# Patient Record
Sex: Female | Born: 1980 | Race: White | Hispanic: No | Marital: Single | Smoking: Former smoker
Health system: Southern US, Community
[De-identification: ages and names within clinical notes are randomized; demographics above are authoritative.]

## PROBLEM LIST (undated history)

## (undated) DIAGNOSIS — F99 Mental disorder, not otherwise specified: Secondary | ICD-10-CM

## (undated) DIAGNOSIS — I10 Essential (primary) hypertension: Secondary | ICD-10-CM

## (undated) DIAGNOSIS — C801 Malignant (primary) neoplasm, unspecified: Secondary | ICD-10-CM

## (undated) DIAGNOSIS — D649 Anemia, unspecified: Secondary | ICD-10-CM

## (undated) DIAGNOSIS — I82409 Acute embolism and thrombosis of unspecified deep veins of unspecified lower extremity: Secondary | ICD-10-CM

## (undated) HISTORY — DX: Mental disorder, not otherwise specified: F99

## (undated) HISTORY — DX: Malignant (primary) neoplasm, unspecified: C80.1

## (undated) HISTORY — DX: Anemia, unspecified: D64.9

## (undated) HISTORY — PX: DILATION AND CURETTAGE OF UTERUS: SHX78

## (undated) HISTORY — DX: Essential (primary) hypertension: I10

## (undated) HISTORY — DX: Acute embolism and thrombosis of unspecified deep veins of unspecified lower extremity: I82.409

---

## 2001-09-22 ENCOUNTER — Emergency Department (HOSPITAL_COMMUNITY): Admission: EM | Admit: 2001-09-22 | Discharge: 2001-09-23 | Payer: Self-pay | Admitting: Emergency Medicine

## 2001-09-23 ENCOUNTER — Encounter: Payer: Self-pay | Admitting: Emergency Medicine

## 2001-10-10 ENCOUNTER — Emergency Department (HOSPITAL_COMMUNITY): Admission: EM | Admit: 2001-10-10 | Discharge: 2001-10-11 | Payer: Self-pay | Admitting: Internal Medicine

## 2002-04-01 ENCOUNTER — Emergency Department (HOSPITAL_COMMUNITY): Admission: EM | Admit: 2002-04-01 | Discharge: 2002-04-01 | Payer: Self-pay | Admitting: *Deleted

## 2002-09-12 ENCOUNTER — Encounter: Payer: Self-pay | Admitting: Family Medicine

## 2002-09-12 ENCOUNTER — Ambulatory Visit (HOSPITAL_COMMUNITY): Admission: RE | Admit: 2002-09-12 | Discharge: 2002-09-12 | Payer: Self-pay | Admitting: Family Medicine

## 2002-09-20 ENCOUNTER — Inpatient Hospital Stay (HOSPITAL_COMMUNITY): Admission: AD | Admit: 2002-09-20 | Discharge: 2002-09-21 | Payer: Self-pay | Admitting: *Deleted

## 2003-07-18 ENCOUNTER — Inpatient Hospital Stay (HOSPITAL_COMMUNITY): Admission: EM | Admit: 2003-07-18 | Discharge: 2003-07-22 | Payer: Self-pay | Admitting: Internal Medicine

## 2003-12-19 ENCOUNTER — Inpatient Hospital Stay (HOSPITAL_COMMUNITY): Admission: EM | Admit: 2003-12-19 | Discharge: 2003-12-25 | Payer: Self-pay | Admitting: *Deleted

## 2004-03-25 ENCOUNTER — Ambulatory Visit (HOSPITAL_COMMUNITY): Admission: RE | Admit: 2004-03-25 | Discharge: 2004-03-25 | Payer: Self-pay | Admitting: Unknown Physician Specialty

## 2004-12-27 ENCOUNTER — Emergency Department (HOSPITAL_COMMUNITY): Admission: EM | Admit: 2004-12-27 | Discharge: 2004-12-27 | Payer: Self-pay | Admitting: Emergency Medicine

## 2005-02-22 ENCOUNTER — Ambulatory Visit (HOSPITAL_COMMUNITY): Admission: RE | Admit: 2005-02-22 | Discharge: 2005-02-22 | Payer: Self-pay | Admitting: Family Medicine

## 2005-03-09 ENCOUNTER — Emergency Department (HOSPITAL_COMMUNITY): Admission: EM | Admit: 2005-03-09 | Discharge: 2005-03-09 | Payer: Self-pay | Admitting: Emergency Medicine

## 2005-05-22 ENCOUNTER — Emergency Department (HOSPITAL_COMMUNITY): Admission: EM | Admit: 2005-05-22 | Discharge: 2005-05-22 | Payer: Self-pay | Admitting: Emergency Medicine

## 2005-08-09 ENCOUNTER — Emergency Department (HOSPITAL_COMMUNITY): Admission: EM | Admit: 2005-08-09 | Discharge: 2005-08-09 | Payer: Self-pay | Admitting: Emergency Medicine

## 2005-08-09 ENCOUNTER — Ambulatory Visit (HOSPITAL_COMMUNITY): Admission: RE | Admit: 2005-08-09 | Discharge: 2005-08-09 | Payer: Self-pay | Admitting: Emergency Medicine

## 2005-08-09 HISTORY — PX: SALPINGOOPHORECTOMY: SHX82

## 2006-03-04 ENCOUNTER — Emergency Department (HOSPITAL_COMMUNITY): Admission: EM | Admit: 2006-03-04 | Discharge: 2006-03-05 | Payer: Self-pay | Admitting: *Deleted

## 2006-04-14 ENCOUNTER — Ambulatory Visit: Payer: Self-pay | Admitting: Oncology

## 2006-06-21 ENCOUNTER — Emergency Department (HOSPITAL_COMMUNITY): Admission: EM | Admit: 2006-06-21 | Discharge: 2006-06-22 | Payer: Self-pay | Admitting: Emergency Medicine

## 2006-08-09 DIAGNOSIS — D649 Anemia, unspecified: Secondary | ICD-10-CM

## 2006-08-09 DIAGNOSIS — C801 Malignant (primary) neoplasm, unspecified: Secondary | ICD-10-CM

## 2006-08-09 HISTORY — DX: Malignant (primary) neoplasm, unspecified: C80.1

## 2006-08-09 HISTORY — DX: Anemia, unspecified: D64.9

## 2006-10-11 ENCOUNTER — Inpatient Hospital Stay (HOSPITAL_COMMUNITY): Admission: RE | Admit: 2006-10-11 | Discharge: 2006-10-14 | Payer: Self-pay | Admitting: Obstetrics and Gynecology

## 2006-10-11 ENCOUNTER — Encounter (INDEPENDENT_AMBULATORY_CARE_PROVIDER_SITE_OTHER): Payer: Self-pay | Admitting: Specialist

## 2007-04-08 ENCOUNTER — Emergency Department (HOSPITAL_COMMUNITY): Admission: EM | Admit: 2007-04-08 | Discharge: 2007-04-08 | Payer: Self-pay | Admitting: Emergency Medicine

## 2007-05-23 ENCOUNTER — Emergency Department (HOSPITAL_COMMUNITY): Admission: EM | Admit: 2007-05-23 | Discharge: 2007-05-23 | Payer: Self-pay | Admitting: Emergency Medicine

## 2007-12-12 ENCOUNTER — Other Ambulatory Visit: Admission: RE | Admit: 2007-12-12 | Discharge: 2007-12-12 | Payer: Self-pay | Admitting: Obstetrics and Gynecology

## 2008-04-11 ENCOUNTER — Inpatient Hospital Stay (HOSPITAL_COMMUNITY): Admission: AD | Admit: 2008-04-11 | Discharge: 2008-04-16 | Payer: Self-pay | Admitting: Obstetrics and Gynecology

## 2008-04-17 ENCOUNTER — Encounter (HOSPITAL_COMMUNITY): Admission: RE | Admit: 2008-04-17 | Discharge: 2008-05-08 | Payer: Self-pay | Admitting: Obstetrics and Gynecology

## 2008-05-09 ENCOUNTER — Encounter (HOSPITAL_COMMUNITY): Admission: RE | Admit: 2008-05-09 | Discharge: 2008-05-23 | Payer: Self-pay | Admitting: Obstetrics and Gynecology

## 2008-06-27 ENCOUNTER — Emergency Department (HOSPITAL_COMMUNITY): Admission: EM | Admit: 2008-06-27 | Discharge: 2008-06-27 | Payer: Self-pay | Admitting: Emergency Medicine

## 2008-07-14 ENCOUNTER — Inpatient Hospital Stay (HOSPITAL_COMMUNITY): Admission: AD | Admit: 2008-07-14 | Discharge: 2008-07-14 | Payer: Self-pay | Admitting: Obstetrics and Gynecology

## 2008-07-16 ENCOUNTER — Ambulatory Visit (HOSPITAL_COMMUNITY): Admission: RE | Admit: 2008-07-16 | Discharge: 2008-07-16 | Payer: Self-pay | Admitting: Obstetrics and Gynecology

## 2008-07-21 ENCOUNTER — Inpatient Hospital Stay (HOSPITAL_COMMUNITY): Admission: AD | Admit: 2008-07-21 | Discharge: 2008-07-29 | Payer: Self-pay | Admitting: Obstetrics and Gynecology

## 2008-07-22 ENCOUNTER — Encounter (INDEPENDENT_AMBULATORY_CARE_PROVIDER_SITE_OTHER): Payer: Self-pay | Admitting: Obstetrics and Gynecology

## 2008-07-25 ENCOUNTER — Encounter (INDEPENDENT_AMBULATORY_CARE_PROVIDER_SITE_OTHER): Payer: Self-pay | Admitting: Obstetrics and Gynecology

## 2008-07-25 ENCOUNTER — Ambulatory Visit: Payer: Self-pay | Admitting: Vascular Surgery

## 2008-08-12 ENCOUNTER — Observation Stay (HOSPITAL_COMMUNITY): Admission: EM | Admit: 2008-08-12 | Discharge: 2008-08-13 | Payer: Self-pay | Admitting: Emergency Medicine

## 2008-10-22 ENCOUNTER — Encounter (HOSPITAL_COMMUNITY): Admission: RE | Admit: 2008-10-22 | Discharge: 2008-11-21 | Payer: Self-pay | Admitting: Family Medicine

## 2009-05-04 ENCOUNTER — Emergency Department (HOSPITAL_COMMUNITY): Admission: EM | Admit: 2009-05-04 | Discharge: 2009-05-04 | Payer: Self-pay | Admitting: Emergency Medicine

## 2009-09-02 ENCOUNTER — Emergency Department (HOSPITAL_COMMUNITY): Admission: EM | Admit: 2009-09-02 | Discharge: 2009-09-02 | Payer: Self-pay | Admitting: Emergency Medicine
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
## 2009-09-16 ENCOUNTER — Other Ambulatory Visit: Admission: RE | Admit: 2009-09-16 | Discharge: 2009-09-16 | Payer: Self-pay | Admitting: Obstetrics & Gynecology

## 2009-11-29 ENCOUNTER — Emergency Department (HOSPITAL_COMMUNITY): Admission: EM | Admit: 2009-11-29 | Discharge: 2009-11-29 | Payer: Self-pay | Admitting: Emergency Medicine

## 2010-01-14 ENCOUNTER — Inpatient Hospital Stay (HOSPITAL_COMMUNITY): Admission: AD | Admit: 2010-01-14 | Discharge: 2010-01-14 | Payer: Self-pay | Admitting: Obstetrics & Gynecology

## 2010-01-14 ENCOUNTER — Ambulatory Visit: Payer: Self-pay | Admitting: Advanced Practice Midwife

## 2010-02-14 ENCOUNTER — Observation Stay (HOSPITAL_COMMUNITY): Admission: AD | Admit: 2010-02-14 | Discharge: 2010-02-15 | Payer: Self-pay | Admitting: Obstetrics and Gynecology

## 2010-02-23 ENCOUNTER — Inpatient Hospital Stay (HOSPITAL_COMMUNITY): Admission: AD | Admit: 2010-02-23 | Discharge: 2010-02-23 | Payer: Self-pay | Admitting: Obstetrics & Gynecology

## 2010-02-23 ENCOUNTER — Ambulatory Visit: Payer: Self-pay | Admitting: Advanced Practice Midwife

## 2010-02-25 ENCOUNTER — Inpatient Hospital Stay (HOSPITAL_COMMUNITY): Admission: RE | Admit: 2010-02-25 | Discharge: 2010-02-28 | Payer: Self-pay | Admitting: Obstetrics and Gynecology

## 2010-07-09 ENCOUNTER — Observation Stay (HOSPITAL_COMMUNITY)
Admission: EM | Admit: 2010-07-09 | Discharge: 2010-07-09 | Payer: Self-pay | Source: Home / Self Care | Admitting: Emergency Medicine

## 2010-08-30 ENCOUNTER — Encounter: Payer: Self-pay | Admitting: Emergency Medicine

## 2010-08-30 ENCOUNTER — Encounter: Payer: Self-pay | Admitting: Family Medicine

## 2010-10-19 LAB — CBC
MCHC: 32.5 g/dL (ref 30.0–36.0)
Platelets: 241 10*3/uL (ref 150–400)
RDW: 14.6 % (ref 11.5–15.5)
WBC: 11.5 10*3/uL — ABNORMAL HIGH (ref 4.0–10.5)

## 2010-10-19 LAB — DIFFERENTIAL
Basophils Absolute: 0 10*3/uL (ref 0.0–0.1)
Basophils Relative: 0 % (ref 0–1)
Lymphocytes Relative: 8 % — ABNORMAL LOW (ref 12–46)
Monocytes Absolute: 0.7 10*3/uL (ref 0.1–1.0)
Neutro Abs: 9.7 10*3/uL — ABNORMAL HIGH (ref 1.7–7.7)
Neutrophils Relative %: 85 % — ABNORMAL HIGH (ref 43–77)

## 2010-10-19 LAB — BASIC METABOLIC PANEL
BUN: 13 mg/dL (ref 6–23)
Calcium: 9.1 mg/dL (ref 8.4–10.5)
Creatinine, Ser: 0.8 mg/dL (ref 0.4–1.2)
GFR calc non Af Amer: >60 mL/min (ref >60)
Glucose, Bld: 114 mg/dL — ABNORMAL HIGH (ref 70–99)
Potassium: 4.4 mEq/L (ref 3.5–5.1)

## 2010-10-24 LAB — CBC
HCT: 27.1 % — ABNORMAL LOW (ref 36.0–46.0)
HCT: 34 % — ABNORMAL LOW (ref 36.0–46.0)
Hemoglobin: 11.4 g/dL — ABNORMAL LOW (ref 12.0–15.0)
Hemoglobin: 9.1 g/dL — ABNORMAL LOW (ref 12.0–15.0)
MCHC: 33.5 g/dL (ref 30.0–36.0)
MCV: 75.3 fL — ABNORMAL LOW (ref 78.0–100.0)
RBC: 3.6 MIL/uL — ABNORMAL LOW (ref 3.87–5.11)
RDW: 17.4 % — ABNORMAL HIGH (ref 11.5–15.5)
WBC: 10.8 10*3/uL — ABNORMAL HIGH (ref 4.0–10.5)
WBC: 11.6 10*3/uL — ABNORMAL HIGH (ref 4.0–10.5)

## 2010-10-24 LAB — COMPREHENSIVE METABOLIC PANEL
ALT: 10 U/L (ref 0–35)
AST: 16 U/L (ref 0–37)
Alkaline Phosphatase: 124 U/L — ABNORMAL HIGH (ref 39–117)
CO2: 20 mEq/L (ref 19–32)
Calcium: 8.5 mg/dL (ref 8.4–10.5)
GFR calc Af Amer: >60 mL/min (ref >60)
GFR calc non Af Amer: >60 mL/min (ref >60)
Glucose, Bld: 88 mg/dL (ref 70–99)
Potassium: 4 mEq/L (ref 3.5–5.1)
Sodium: 136 mEq/L (ref 135–145)
Total Protein: 5.5 g/dL — ABNORMAL LOW (ref 6.0–8.3)

## 2010-10-24 LAB — URINALYSIS, ROUTINE W REFLEX MICROSCOPIC
Bilirubin Urine: NEGATIVE
Hgb urine dipstick: NEGATIVE
Ketones, ur: 15 mg/dL — AB
Specific Gravity, Urine: >1.03 — ABNORMAL HIGH (ref 1.005–1.030)
pH: 6.5 (ref 5.0–8.0)

## 2010-10-24 LAB — URINE CULTURE: Culture  Setup Time: 201107182136

## 2010-10-24 LAB — GLUCOSE, CAPILLARY: Glucose-Capillary: 82 mg/dL (ref 70–99)

## 2010-10-24 LAB — URINE MICROSCOPIC-ADD ON

## 2010-10-24 LAB — TSH: TSH: 1.608 u[IU]/mL (ref 0.350–4.500)

## 2010-10-25 LAB — URINALYSIS, ROUTINE W REFLEX MICROSCOPIC
Ketones, ur: 15 mg/dL — AB
Nitrite: NEGATIVE
Nitrite: NEGATIVE
Protein, ur: NEGATIVE mg/dL
Specific Gravity, Urine: >1.03 — ABNORMAL HIGH (ref 1.005–1.030)
Urobilinogen, UA: 0.2 mg/dL (ref 0.0–1.0)
pH: 5.5 (ref 5.0–8.0)

## 2010-10-25 LAB — BARBITURATE, URINE, CONFIRMATION
Amobarbital UR Quant: NEGATIVE
Butalbital UR Quant: 550 ng/mL
Pentobarbital GC/MS Conf: NEGATIVE
Phenobarbital GC/MS Conf: NEGATIVE

## 2010-10-25 LAB — DIFFERENTIAL
Basophils Relative: 0 % (ref 0–1)
Lymphs Abs: 0.7 10*3/uL (ref 0.7–4.0)
Monocytes Relative: 5 % (ref 3–12)
Neutro Abs: 7.8 10*3/uL — ABNORMAL HIGH (ref 1.7–7.7)
Neutrophils Relative %: 86 % — ABNORMAL HIGH (ref 43–77)

## 2010-10-25 LAB — HCG, QUANTITATIVE, PREGNANCY: hCG, Beta Chain, Quant, S: 20990 m[IU]/mL — ABNORMAL HIGH (ref <5)

## 2010-10-25 LAB — CBC
HCT: 35.4 % — ABNORMAL LOW (ref 36.0–46.0)
Hemoglobin: 10.4 g/dL — ABNORMAL LOW (ref 12.0–15.0)
MCHC: 33.2 g/dL (ref 30.0–36.0)
MCV: 75 fL — ABNORMAL LOW (ref 78.0–100.0)
Platelets: 155 10*3/uL (ref 150–400)
RBC: 4.55 MIL/uL (ref 3.87–5.11)
RDW: 17 % — ABNORMAL HIGH (ref 11.5–15.5)
WBC: 12 10*3/uL — ABNORMAL HIGH (ref 4.0–10.5)
WBC: 8.4 10*3/uL (ref 4.0–10.5)
WBC: 9 10*3/uL (ref 4.0–10.5)

## 2010-10-25 LAB — MRSA CULTURE

## 2010-10-25 LAB — DRUGS OF ABUSE SCREEN W/O ALC, ROUTINE URINE
Amphetamine Screen, Ur: NEGATIVE
Cocaine Metabolites: NEGATIVE
Methadone: NEGATIVE
Phencyclidine (PCP): NEGATIVE

## 2010-10-25 LAB — PROTIME-INR
INR: 0.96 (ref 0.00–1.49)
Prothrombin Time: 12.7 seconds (ref 11.6–15.2)

## 2010-10-25 LAB — BASIC METABOLIC PANEL
BUN: 4 mg/dL — ABNORMAL LOW (ref 6–23)
CO2: 23 mEq/L (ref 19–32)
Calcium: 9.2 mg/dL (ref 8.4–10.5)
Calcium: 8.4 mg/dL (ref 8.4–10.5)
Chloride: 105 mEq/L (ref 96–112)
Creatinine, Ser: 0.56 mg/dL (ref 0.4–1.2)
GFR calc Af Amer: >60 mL/min (ref >60)
Glucose, Bld: 83 mg/dL (ref 70–99)
Potassium: 4.2 mEq/L (ref 3.5–5.1)
Sodium: 135 mEq/L (ref 135–145)

## 2010-10-25 LAB — COMPREHENSIVE METABOLIC PANEL
ALT: 9 U/L (ref 0–35)
AST: 12 U/L (ref 0–37)
CO2: 21 mEq/L (ref 19–32)
Calcium: 8.5 mg/dL (ref 8.4–10.5)
GFR calc Af Amer: >60 mL/min (ref >60)
GFR calc non Af Amer: >60 mL/min (ref >60)
Potassium: 3.6 mEq/L (ref 3.5–5.1)
Sodium: 135 mEq/L (ref 135–145)
Total Protein: 5.6 g/dL — ABNORMAL LOW (ref 6.0–8.3)

## 2010-10-25 LAB — SURGICAL PCR SCREEN: Staphylococcus aureus: INVALID — AB

## 2010-10-25 LAB — APTT: aPTT: 22 seconds — ABNORMAL LOW (ref 24–37)

## 2010-10-25 LAB — GLUCOSE, CAPILLARY: Glucose-Capillary: 83 mg/dL (ref 70–99)

## 2010-10-26 LAB — URINALYSIS, ROUTINE W REFLEX MICROSCOPIC
Glucose, UA: NEGATIVE mg/dL
Ketones, ur: NEGATIVE mg/dL
Nitrite: NEGATIVE
Protein, ur: NEGATIVE mg/dL
pH: 5 (ref 5.0–8.0)

## 2010-10-26 LAB — FETAL FIBRONECTIN: Fetal Fibronectin: NEGATIVE

## 2010-10-26 LAB — STREP B DNA PROBE

## 2010-10-26 LAB — GC/CHLAMYDIA PROBE AMP, GENITAL: GC Probe Amp, Genital: NEGATIVE

## 2010-10-27 LAB — DIFFERENTIAL
Basophils Absolute: 0 10*3/uL (ref 0.0–0.1)
Basophils Relative: 0 % (ref 0–1)
Lymphocytes Relative: 13 % (ref 12–46)
Monocytes Absolute: 0.7 10*3/uL (ref 0.1–1.0)
Neutro Abs: 8.1 10*3/uL — ABNORMAL HIGH (ref 1.7–7.7)
Neutrophils Relative %: 80 % — ABNORMAL HIGH (ref 43–77)

## 2010-10-27 LAB — COMPREHENSIVE METABOLIC PANEL
Albumin: 3 g/dL — ABNORMAL LOW (ref 3.5–5.2)
Alkaline Phosphatase: 62 U/L (ref 39–117)
BUN: 6 mg/dL (ref 6–23)
CO2: 24 mEq/L (ref 19–32)
Chloride: 105 mEq/L (ref 96–112)
Creatinine, Ser: 0.49 mg/dL (ref 0.4–1.2)
GFR calc non Af Amer: >60 mL/min (ref >60)
Glucose, Bld: 75 mg/dL (ref 70–99)
Potassium: 3.9 mEq/L (ref 3.5–5.1)
Total Bilirubin: 0.3 mg/dL (ref 0.3–1.2)

## 2010-10-27 LAB — URINALYSIS, ROUTINE W REFLEX MICROSCOPIC
Bilirubin Urine: NEGATIVE
Nitrite: NEGATIVE
Specific Gravity, Urine: >1.03 — ABNORMAL HIGH (ref 1.005–1.030)
Urobilinogen, UA: 0.2 mg/dL (ref 0.0–1.0)
pH: 6 (ref 5.0–8.0)

## 2010-10-27 LAB — CBC
HCT: 33.5 % — ABNORMAL LOW (ref 36.0–46.0)
Hemoglobin: 11.6 g/dL — ABNORMAL LOW (ref 12.0–15.0)
MCV: 77.3 fL — ABNORMAL LOW (ref 78.0–100.0)
Platelets: 202 10*3/uL (ref 150–400)
WBC: 10.1 10*3/uL (ref 4.0–10.5)

## 2010-11-23 LAB — ABO/RH: ABO/RH(D): A POS

## 2010-11-23 LAB — CBC
HCT: 35.9 % — ABNORMAL LOW (ref 36.0–46.0)
Hemoglobin: 11.1 g/dL — ABNORMAL LOW (ref 12.0–15.0)
Hemoglobin: 11.8 g/dL — ABNORMAL LOW (ref 12.0–15.0)
MCHC: 32 g/dL (ref 30.0–36.0)
MCHC: 32.8 g/dL (ref 30.0–36.0)
MCV: 77.2 fL — ABNORMAL LOW (ref 78.0–100.0)
Platelets: 249 10*3/uL (ref 150–400)
RBC: 4.47 MIL/uL (ref 3.87–5.11)
RDW: 16.1 % — ABNORMAL HIGH (ref 11.5–15.5)

## 2010-11-23 LAB — BASIC METABOLIC PANEL
CO2: 25 mEq/L (ref 19–32)
Chloride: 109 mEq/L (ref 96–112)
GFR calc Af Amer: >60 mL/min (ref >60)
Potassium: 3.3 mEq/L — ABNORMAL LOW (ref 3.5–5.1)
Sodium: 141 mEq/L (ref 135–145)

## 2010-11-23 LAB — DIFFERENTIAL
Basophils Absolute: 0 10*3/uL (ref 0.0–0.1)
Basophils Relative: 0 % (ref 0–1)
Basophils Relative: 0 % (ref 0–1)
Eosinophils Absolute: 0.1 10*3/uL (ref 0.0–0.7)
Eosinophils Relative: 1 % (ref 0–5)
Monocytes Absolute: 0.3 10*3/uL (ref 0.1–1.0)
Monocytes Absolute: 0.5 10*3/uL (ref 0.1–1.0)
Monocytes Relative: 7 % (ref 3–12)
Neutro Abs: 4.7 10*3/uL (ref 1.7–7.7)
Neutro Abs: 6.4 10*3/uL (ref 1.7–7.7)

## 2010-11-23 LAB — POCT I-STAT, CHEM 8
BUN: 7 mg/dL (ref 6–23)
Chloride: 110 mEq/L (ref 96–112)
Sodium: 142 mEq/L (ref 135–145)

## 2010-12-20 ENCOUNTER — Emergency Department (HOSPITAL_COMMUNITY)
Admission: EM | Admit: 2010-12-20 | Discharge: 2010-12-20 | Disposition: A | Discharge status: Home / Self Care | Payer: Medicaid Other | Attending: Emergency Medicine | Admitting: Emergency Medicine

## 2010-12-20 ENCOUNTER — Emergency Department (HOSPITAL_COMMUNITY): Payer: Medicaid Other

## 2010-12-20 DIAGNOSIS — R112 Nausea with vomiting, unspecified: Secondary | ICD-10-CM | POA: Insufficient documentation

## 2010-12-20 DIAGNOSIS — J45909 Unspecified asthma, uncomplicated: Secondary | ICD-10-CM | POA: Insufficient documentation

## 2010-12-20 DIAGNOSIS — R079 Chest pain, unspecified: Secondary | ICD-10-CM | POA: Insufficient documentation

## 2010-12-20 DIAGNOSIS — R0602 Shortness of breath: Secondary | ICD-10-CM | POA: Insufficient documentation

## 2010-12-20 DIAGNOSIS — Z86718 Personal history of other venous thrombosis and embolism: Secondary | ICD-10-CM | POA: Insufficient documentation

## 2010-12-20 LAB — URINALYSIS, ROUTINE W REFLEX MICROSCOPIC
Glucose, UA: NEGATIVE mg/dL
Protein, ur: 30 mg/dL — AB

## 2010-12-20 LAB — DIFFERENTIAL
Eosinophils Absolute: 0.1 10*3/uL (ref 0.0–0.7)
Eosinophils Relative: 1 % (ref 0–5)
Lymphs Abs: 2.9 10*3/uL (ref 0.7–4.0)
Monocytes Absolute: 0.6 10*3/uL (ref 0.1–1.0)
Monocytes Relative: 6 % (ref 3–12)

## 2010-12-20 LAB — URINE MICROSCOPIC-ADD ON

## 2010-12-20 LAB — CBC
MCH: 24.9 pg — ABNORMAL LOW (ref 26.0–34.0)
MCHC: 32.1 g/dL (ref 30.0–36.0)
MCV: 77.8 fL — ABNORMAL LOW (ref 78.0–100.0)
Platelets: 226 10*3/uL (ref 150–400)
RDW: 14.2 % (ref 11.5–15.5)

## 2010-12-20 LAB — BASIC METABOLIC PANEL
BUN: 14 mg/dL (ref 6–23)
CO2: 24 mEq/L (ref 19–32)
Calcium: 10 mg/dL (ref 8.4–10.5)
GFR calc non Af Amer: >60 mL/min (ref >60)
Glucose, Bld: 119 mg/dL — ABNORMAL HIGH (ref 70–99)

## 2010-12-20 LAB — POCT CARDIAC MARKERS: Troponin i, poc: <0.05 ng/mL (ref 0.00–0.09)

## 2010-12-22 NOTE — H&P (Signed)
[name], [name]              ACCOUNT NO.:  [id number]   MEDICAL RECORD NO.:  [id number]          PATIENT TYPE:  OUT   LOCATION:  ULT                           [facility]ILITY:  [facility]   PHYSICIAN:  Charles A. Delcambre, MDDATE OF BIRTH:  1981/07/21   DATE OF ADMISSION:  07/16/2008  DATE OF DISCHARGE:                              HISTORY & PHYSICAL   HISTORY OF PRESENT ILLNESS:  A 30 year old gravida 1, para 0 to be  admitted on 07/21/2008 at 7:30 for induction.  She will be 39 weeks and  1 day with pregnancy complicated by history of a DVT and pulmonary  embolism, on birth control pills.  I spoke with prenatal doctor who I  cannot recall the name of, and she was noted to be told that she was  factor V Leiden positive, but we were unable to get more information.  I  spoke to Dr. Sherrie George at that time, I believe, and she did recommend  therapeutic heparin throughout the pregnancy, I do not have that  information in front of me so I may be in error in stating that, and  planned to have therapeutic heparin during pregnancy with Lovenox.  She  was changed to heparin approximately 1 week ago.  She is obese, and for  this reason, she has bruising on her abdomen as evidence of heparin  injection, but heparin levels have been subtherapeutic and even,  basically, 0 on checks on Lovenox  for anti-Xa routine run as well as a  heparin level 2 days ago with heparin level drawn.  She has been on  10,000 units subcu q.8 h. over the last week.  She has had no other  complications during her pregnancy.   PAST MEDICAL HISTORY:  As noted above.  Repeat factor V Leiden by me was  negative.  She did not follow up with a Maternal-Fetal consult as  directed on several occasions, with missed appointment.  Family history  is very strong for DVT in her father.  Past medical history:  Pulmonary  embolus; asthma, not on medications; endometriosis, presumed, diagnosed  at age 17; anemia, postherpetic neuralgia.   PAST SURGICAL HISTORY:  D and C in the past of endometrial lining with  menorrhagia, benign pathology.  She had a laparotomy with a large  ovarian mass, and I believe it was the left ovary, I do not have that  information in front of me, and this tumor did come back.  She had left  ovary and tube removed, Sertoli-Leydig cell tumor of the ovary, benign  fallopian tubes, and this was in March 2008.   MEDICATIONS:  Prenatal vitamins currently, heparin 10,000 units subcu  q.8 h.   ALLERGIES:  No known drug allergies.   SOCIAL HISTORY:  Not married, not currently sexually active, does not  know father of the baby.  No alcohol or drugs.   FAMILY HISTORY:  As noted above, otherwise noncontributory.   PHYSICAL EXAMINATION:  VITALS:  Weight 312 pounds, blood pressure  132/98, no symptoms of PIH, this was at 38 weeks 3 days in December  2008; respirations  18, afebrile, and pulse 80.  CORONARY:  Regular rate and rhythm.  LUNGS:  Clear bilaterally.  ABDOMEN:  Difficult to palpate accurately fundal height secondary to  previous surgery and obesity.  Fetal heart rate has been in the 150s.  On that day, she notes to have had good fetal movement.  Cervix was  examined and was closed, posterior.  EXTREMITIES:  Mild edema bilaterally and symmetrical.   ASSESSMENT:  Intrauterine pregnancy 39 weeks 1 day, history of deep  venous thrombosis and pulmonary embolus, survived, obviously, and to be  admitted at 39 weeks 1 day secondary to difficulties with heparin as  noted above and history of deep venous thrombosis and pulmonary embolus.   PLAN:  Admit, Cervidil, once appropriate AROM.  We will therapeutically  heparinize IV until AROM or until she gets to 3-4 cm and active; we will  reverse heparin if necessary.  She understands that if we go 48 hours  without significant change that we will discontinue the heparin at the  appropriate time and proceed to a cesarean section.  All questions were   answered.  She will be admitted as noted, Ambien as needed, and heparin  per pharmacy.      Charles A. Sydnee Cabal, MD  Electronically Signed     CAD/MEDQ  D:  07/17/2008  T:  07/17/2008  Job:  [id number]
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
## 2010-12-22 NOTE — Discharge Summary (Signed)
NAMEELFRIEDE, BONINI              ACCOUNT NO.:  [id number]   MEDICAL RECORD NO.:  [id number]          PATIENT TYPE:  INP   LOCATION:  9306                          [facility]ACILITY:  [facility]   PHYSICIAN:  Gerald Leitz, MD          DATE OF BIRTH:  [birth year]   DATE OF ADMISSION:  04/11/2008  DATE OF DISCHARGE:  04/16/2008                               DISCHARGE SUMMARY   INDICATION FOR ADMISSION:  1. A 24-5/7 week intrauterine pregnancy.  2. Status post fall, rule out rupture.  3. Muscle spasms.  4. Back pain.  5. Factor V Leiden mutation.  6. History of previous deep vein thrombosis in 2005.   DISCHARGE DIAGNOSES:  1. A 25-week 2-day intrauterine pregnancy.  2. Status post fall.  3. Muscle spasms.  4. Back pain.  5. Factor V Leiden mutation.  6. History of previous deep vein thrombosis in 2005.   BRIEF HOSPITAL COURSE:  The patient was admitted on April 11, 2008,  after being transferred from [facility].  She sustained a fall  with no injury to her abdomen, however, had significant back pain and  muscle spasms and was unable to ambulate.  During her admission, she  improved significantly.  She received physical therapy daily.  She also  had rule out abruption.   LABS:  KUB was negative.  Ultrasound performed on April 11, 2008,  showed no evidence of abruption.   She received Flexeril and Dilaudid initially for pain as her mobility  and pain improved.  Dilaudid was discontinued and she is discharged home  on the following medications, Flexeril 10 mg 1 p.o. q.8 h. p.r.n. as  well as oxycodone 1-2 p.o. q.4-6 h.  The patient has a history of factor  five Leiden mutation and is on Lovenox 40 mg subcu b.i.d.  she will  continue this at home as well as well as her prenatal vitamins.   ACTIVITY:  Ad lib.  She is also to undergo outpatient physical therapy  at least 3 days a week.   CONDITION AT DISCHARGE:  Improved and stable.   FOLLOWUP:  She will follow up with Dr.  Sydnee Cabal in approximately 2  weeks.      Gerald Leitz, MD  Electronically Signed     TC/MEDQ  D:  04/16/2008  T:  04/16/2008  Job:  [id number]
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
## 2010-12-22 NOTE — Op Note (Signed)
[name], [name]              ACCOUNT NO.:  [id number]   MEDICAL RECORD NO.:  [id number]          PATIENT TYPE:  INP   LOCATION:  9122                          [facility]ACILITY:  [facility]   PHYSICIAN:  Charles A. Delcambre, MDDATE OF BIRTH:  05-29-1981   DATE OF PROCEDURE:  07/23/2008  DATE OF DISCHARGE:                               OPERATIVE REPORT   PREOPERATIVE DIAGNOSES:  1. Intrauterine pregnancy at 39 weeks and 3 days.  2. History of pulmonary embolus/deep venous thrombosis on birth      control pills.  3. Fetal intolerance of labor.   POSTOPERATIVE DIAGNOSES:  1. Intrauterine pregnancy at 39 weeks and 3 days.  2. History of pulmonary embolus/deep venous thrombosis on birth      control pills.  3. Fetal intolerance of labor.  4. Pelvic adhesive disease as well.   PROCEDURES:  1. Primary low transverse cesarean section.  2. Lysis of adhesions.   SURGEON:  Charles A. Sydnee Cabal, MD.   ASSISTANT:  Gerald Leitz, MD   COMPLICATIONS:  None.   ESTIMATED BLOOD LOSS:  600 mL.   ANESTHESIA:  Epidural.   OPERATIVE FINDINGS:  Multiple omental adhesions to the anterior  abdominal wall.   SPECIMEN:  Placenta to pathology.  Cord arterial blood gas pH was 7.31.  Cord venous blood gas pH was 7.37.  Infant was a vigorous female, 8 pounds  4 ounces.   Instrument, sponge, and needle count correct x2.   DESCRIPTION OF PROCEDURE:  The patient was taken to the operating room  and placed in supine position.  Anesthesia was dosed.  Epidural was  adequate.  Vertical skin incision was made to encircle the skin lesion  just beneath the umbilicus.  This was sent to pathology.  The knife was  used to remove the old scar and a vertical incision carried down through  the subcutaneous layer to fascia.  The fascia was incised with knife and  Mayo scissors.  Peritoneum was entered without injury to the bladder or  bowel.  Omental adhesions were taken down successfully with isolation of  pedicles and 2-0  Vicryl ties.  This allowed adequate exposure of the  lower uterine segment and freed up the fascia on either side.  The  bladder blade was placed and vesicouterine peritoneum was incised with  the Metzenbaum scissors.  Blunt dissection was used to develop the  bladder flap.  Bladder blade was replaced.  A lower uterine segment  transverse incision amniotomy was made.  There was no damage to the  infant.  Clear amniotic fluid was noted.  Fundal pressure by the  operator's assistant was used to deliver the baby without difficulty.  Cord was clamped and infant was cut free and handed to neonatologist,  Ruben Gottron, who was in attendance for the delivery.  Cord gases were  taken.  Cord blood was taken.  Placenta was manually expressed.  Uterus  was then wiped with a dry lap.  Uterine incision was then closed with #1  chromic running locking suture with 2 figure-of-eight sutures placed.  This achieved good hemostasis.  Irrigation was carried  out.  Hemostasis  was verified.  Bladder flap hemostasis was excellent.  The right ovary  was visualized and was normal.  The left ovary was surgically absent.  The fascia and peritoneum was then closed en bloc with #1 PDS running  nonlocking suture.  The subcutaneous layer was irrigated.  Minor  electrocautery was placed to achieve hemostasis.  Plain gut 0 was used  to place the interrupted sutures in the deep fatty layer subcutaneously  closing this area.  The staples were used to close the skin.  Sterile  dressing was applied.  The patient tolerated the procedure well and was  taken to recovery with physician in attendance.      Charles A. Sydnee Cabal, MD  Electronically Signed     CAD/MEDQ  D:  07/23/2008  T:  07/24/2008  Job:  [id number]

## 2010-12-25 ENCOUNTER — Emergency Department (HOSPITAL_COMMUNITY): Payer: Medicaid Other

## 2010-12-25 ENCOUNTER — Emergency Department (HOSPITAL_COMMUNITY)
Admission: EM | Admit: 2010-12-25 | Discharge: 2010-12-25 | Disposition: A | Discharge status: Home / Self Care | Payer: Medicaid Other | Attending: Emergency Medicine | Admitting: Emergency Medicine

## 2010-12-25 DIAGNOSIS — M79609 Pain in unspecified limb: Secondary | ICD-10-CM | POA: Insufficient documentation

## 2010-12-25 DIAGNOSIS — M7989 Other specified soft tissue disorders: Secondary | ICD-10-CM | POA: Insufficient documentation

## 2010-12-25 DIAGNOSIS — Z86711 Personal history of pulmonary embolism: Secondary | ICD-10-CM | POA: Insufficient documentation

## 2010-12-25 DIAGNOSIS — S93609A Unspecified sprain of unspecified foot, initial encounter: Secondary | ICD-10-CM | POA: Insufficient documentation

## 2010-12-25 DIAGNOSIS — Y92009 Unspecified place in unspecified non-institutional (private) residence as the place of occurrence of the external cause: Secondary | ICD-10-CM | POA: Insufficient documentation

## 2010-12-25 DIAGNOSIS — IMO0002 Reserved for concepts with insufficient information to code with codable children: Secondary | ICD-10-CM | POA: Insufficient documentation

## 2010-12-25 DIAGNOSIS — J45909 Unspecified asthma, uncomplicated: Secondary | ICD-10-CM | POA: Insufficient documentation

## 2010-12-25 DIAGNOSIS — D6859 Other primary thrombophilia: Secondary | ICD-10-CM | POA: Insufficient documentation

## 2010-12-25 DIAGNOSIS — Z86718 Personal history of other venous thrombosis and embolism: Secondary | ICD-10-CM | POA: Insufficient documentation

## 2010-12-25 DIAGNOSIS — W230XXA Caught, crushed, jammed, or pinched between moving objects, initial encounter: Secondary | ICD-10-CM | POA: Insufficient documentation

## 2010-12-25 NOTE — Op Note (Signed)
NAMEARLISHA, PATALANO              ACCOUNT NO.:  [id number]   MEDICAL RECORD NO.:  [id number]          PATIENT TYPE:  INP   LOCATION:  9399                          FACILITY:  [facility]   PHYSICIAN:  Charles A. 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   SURGEON:  Charles A. Sydnee Cabal, MD   ASSISTANT:  Gerald Leitz, MD   COMPLICATIONS:  None.   ESTIMATED BLOOD LOSS:  200 mL.   ANESTHESIA:  General by the endotracheal route.   SPECIMEN:  Left tube and ovary to pathology, see above, frozen;  permanent is pending.   IV FLUIDS:  1700 lactated Ringer's   URINE OUTPUT:  See OR record.   INSTRUMENT SPONGE AND NEEDLE COUNTS:  Correct x2.   DESCRIPTION OF PROCEDURE:  The patient was taken to the operating room,  placed in the supine position; and general anesthetic was induced  without difficulty.  Sterile prep and drape was undertaken.  A vertical  skin incision was made, and carried down to fascia.  Fascia was incised  with the knife and Mayo scissors.  Peritoneum was entered carefully with  Metzenbaum scissors.  No damage to bowel, bladder, or vasculature  structures was noted.  Ovarian mass could be seen.  Incision was  extended and a self-retaining retractor was placed.  Bowel was packed  away from the ovary.  Infundibulopelvic pedicle was skeletonized and  cross clamped; and uterine ovarian pedicle was clamped.  The ovarian  mass was removed and sent to pathology.   Bleeding was encountered at the infundibulopelvic pedicle; this was  retied carefully.  There was a window of peritoneum that continued  oozing; and 2-0 Vicryl was used to achieve hemostasis in this  area.  There was a small hematoma noted, at that time, approximately 3 x 3 cm  on the side wall, just inferior to the infundibulopelvic pedicle.  This  was observed during the case; and was noted to be stable.   Attention was turned to the uterus, and uterus appeared normal  otherwise; #0 Vicryl was used to transfixion stitch the utero-ovarian  pedicle; and the peritoneal edge was run and locked with 2-0 Vicryl  between the sidewall and the infundibulopelvic and utero-ovarian  pedicle.  The right ovary appeared normal.  Uterus was normal.  Tube was  ovary on the right.  Irrigation was carried out and hemostasis was  excellent.  Hematoma was stable; and with that reason the procedure was  terminated; and decision was made to close.  The laps were removed; and  then retractor was removed.  The counts were correct; and the fascia was  then closed with running #1 PDS nonlocking suture.  Subcutaneous 2-0  plain gut was used to close the subcutaneous space.  Hemostasis was  verified.  Irrigation was  carried out, before closure of all layers.  Sterile skin clips were used to close the skin.  A sterile dressing was  applied.  The patient was taken to recovery with physician in attendance      Charles A. Sydnee Cabal, MD  Electronically Signed     CAD/MEDQ  D:  10/11/2006  T:  10/11/2006  Job:  [id number]

## 2010-12-25 NOTE — Group Therapy Note (Signed)
NAME:  [name], [name]                        ACCOUNT NO.:  [id number]   MEDICAL RECORD NO.:  [id number]                   PATIENT TYPE:  INP   LOCATION:  A214                                 FACILITY:  [facility]   PHYSICIAN:  Edward L. Juanetta Gosling, M.D.             DATE OF BIRTH:  02-05-1981   DATE OF PROCEDURE:  DATE OF DISCHARGE:                                   PROGRESS NOTE   PROBLEMS:  Pulmonary embolism, rectal bleeding probably from hemorrhoids,  menorrhagia.   HISTORY OF PRESENT ILLNESS:  [name] had some rectal bleeding after being  seen yesterday.  She was transferred to second floor as a precautionary  measure considering that she is on Lovenox.   PHYSICAL EXAMINATION:  VITAL SIGNS:  Temperature 97.3, pulse 95,  respirations 20, blood pressure 126/79.  O2 saturation 100% on 2 liters.  EXTREMITIES:  She says she feels okay, except she still has a lot of left  leg pain.   LABORATORY DATA:  Hemoglobin level is now 7.5.  She was 8.3 on admission  with a very low MCV.  Prothrombin time is 13.9.   ASSESSMENT:  She is anemic and probably will require blood transfusion.  I  will leave that decision to her primary care physicians.  Otherwise, she  seems to be doing fairly well.  Anticoagulation is underway.      ___________________________________________                                            Oneal Deputy. Juanetta Gosling, M.D.   ELH/MEDQ  D:  12/20/2003  T:  12/20/2003  Job:  [id number]

## 2010-12-25 NOTE — H&P (Signed)
NAME:  [name], [name]                        ACCOUNT NO.:  [id number]   MEDICAL RECORD NO.:  [id number]                   PATIENT TYPE:  INP   LOCATION:  A214                                 FACILITY:  [facility]   PHYSICIAN:  Langley Gauss, M.D.                DATE OF BIRTH:  1981-06-13   DATE OF ADMISSION:  DATE OF DISCHARGE:                                HISTORY & PHYSICAL   HISTORY OF PRESENT ILLNESS:  The patient is a 30 year old gravida 0, para 0  with last menstrual period onset of Dec 20, 2003.  The patient is currently  admitted with findings of DVT and pulmonary embolus.  The patient presented  to the emergency room and was admitted on Dec 19, 2003, with a swollen leg  and subsequent workup also evaluated pulmonary embolization.  The patient is  currently receiving full anticoagulation treatment.  She initially was  treated with IV heparin and then switched to Lovenox, and has been started  recently on Coumadin.  The patient had onset of menstrual bleeding on Dec 20, 2003.  Consultation is requested as she is currently anemic and being  transfused.  Consultation requested in an effort to limit current bleeding  problems.  The patient has been followed by Dr. Almetta Lovely in [place], and  has been on continuous Necon 1/50 since January, 2005.  She states that this  is generally why her bleeding is improved.  She states that on this regimen  since January of 2005, she has had about five days of bleeding each month  with passage of some clots.  She had been treated with cyclical birth  control pills with no marked improvement in her bleeding pattern.  The  patient is known to me from previous medical care provided September 10, 2002.  At that time, the patient presented with dysfunctional uterine bleeding,  marked obesity, and hemoglobin of 6.4.  She received IV heparin.  She was  transfused, and had a D&C performed outpatient basis at that time which  revealed findings of  dyssynchronous endometrium consistent with chronic  anovulatory state.  The patient was recommended to follow up for serial  Provera withdrawals; however, subsequently failed to follow up for  continuing and ongoing care.  Subsequently she continued with her pattern of  irregular bleeding episodes related to her marked obesity and probable  chronic anovulatory cycle.  She has been seen on two occasions since that  time by Dr. Almetta Lovely in [place].   Notably, the patient was on birth control pills for menstrual regulation as  well as to provide birth control purposes.  The oral contraceptives have  been appropriately discontinued upon her diagnosis of the DVT and PE.  She  will require birth control in the very near future.   PAST MEDICAL HISTORY:  Pertinent for a positive family history for  thromboembolic disorder.   She had a kidney stone  in 1998.  No other medical or surgical history.  She  is noted to be markedly obese.  She does describe a greater than 100 pound  weight loss.  In February of 2004, she was 303 pounds, and was 5 feet, 5  inches tall.   CURRENT MEDICATIONS:  Necon 1/50.   PHYSICAL EXAMINATION:  GENERAL:  She is noted to be an obese female though  in no acute distress.  She is receiving some supplemental oxygen therapy.  VITAL SIGNS:  Temperature 98.6, blood pressure 107/63, pulse 92, respiratory  rate 18.  HEENT:  Negative.  There is no adenopathy.  ABDOMEN:  Obese, no palpable masses identified.  PELVIC:  Exam reveals moderate vaginal flow.  The patient describes the  current menses as what she typically experiences.  She denies any passage of  clots at this point in time.  Bimanual examination is not performed.   LABORATORY DATA:  Dec 21, 2003, hemoglobin 6.9, hematocrit 22.8.   Review of old medical record brought from the office reveals a pelvic  ultrasound dated September 12, 2002, which revealed subserosal pedunculated  fibroid, maximum diameter 6.6 cm.   Uterus itself, total length 7.2 cm,  slightly thickened endometrial stripe at 14.2 mm.  Normal right and left  ovary.   ASSESSMENT:  36. A 30 year old patient with longstanding problems with irregular menstrual     periods due to her morbid obesity, now diagnosed with PE and DVT.  Thus,     oral contraceptives would be contraindicated in the future. She will     require immediate initiation of birth control to avoid the complicating     factor of pregnancy.  She is currently being anticoagulated and has now     appropriately started a menses after discontinuation of birth control     pills which would be as expected.  The patient is recommended to begin     treatment with Depo-Provera 150 mg IM for birth control purposes.  In     addition, though this may additionally result in some irregular bleeding,     over the long term, will result in endometrial atrophy as well as     possible shrinkage on the uterine fibroid due to its anti-estrogenic     effects.  2. During this hospitalization, in addition, ultrasound can be repeated to     ascertain whether there has been in any change in the status of her     pedunculated fibroid.  Pertinently, though, she has been under the care     of Dr. Almetta Lovely.  No additional ultrasounds have been performed     since that dated September 12, 2002.     ___________________________________________                                         Langley Gauss, M.D.   DC/MEDQ  D:  12/22/2003  T:  12/22/2003  Job:  [id number]   cc:   [facility]   Jeoffrey Massed, M.D.  [address]  [place]  [place] [address]  Fax: [phone]

## 2010-12-25 NOTE — Group Therapy Note (Signed)
NAME:  LISSETE, MAESTAS                        ACCOUNT NO.:  [id number]   MEDICAL RECORD NO.:  [id number]                   PATIENT TYPE:  INP   LOCATION:  A214                                 FACILITY:  [facility]   PHYSICIAN:  Edward L. Juanetta Gosling, M.D.             DATE OF BIRTH:  15-Oct-1980   DATE OF PROCEDURE:  12/23/2003  DATE OF DISCHARGE:                                   PROGRESS NOTE   PROBLEMS:  Pulmonary embolism.   SUBJECTIVE:  Ms. Breisch says she is feeling a little better.  She still has  an occasional bout of being short of breath, but the nebulizer treatments  help.  Her leg is still quite painful.   OBJECTIVE:  Her exam this morning shows her temperature is 96.7, pulse 83,  respirations 20, blood pressure 114/62, O2 saturation 99%.  Chest is  clearer.  She looks better in general.   Lab work shows that her hemoglobin is 8.5.  B-met shows all normal.  Prothrombin time is 16.5 with INR of 1.5.  I have discussed this with Dr.  Gerda Diss.  It is okay for her to be up and moving, and she can go ahead with  increasing dose of Coumadin to see if we can get her prothrombin time to  therapeutic level.      ___________________________________________                                            Oneal Deputy. Juanetta Gosling, M.D.   ELH/MEDQ  D:  12/23/2003  T:  12/23/2003  Job:  [id number]

## 2010-12-25 NOTE — Group Therapy Note (Signed)
NAME:  DYMON, [name]                        ACCOUNT NO.:  [id number]   MEDICAL RECORD NO.:  [id number]                   PATIENT TYPE:  INP   LOCATION:  A214                                 FACILITY:  [facility]   PHYSICIAN:  Edward L. Juanetta Gosling, M.D.             DATE OF BIRTH:  Nov 16, 1980   DATE OF PROCEDURE:  DATE OF DISCHARGE:                                   PROGRESS NOTE   PROBLEMS:  1. Pulmonary embolus.  2. Thrombophlebitis.   SUBJECTIVE:  [name] says that she feels a little better today.  She was  able to move around in the room a little bit yesterday, which is an  improvement.   PHYSICAL EXAMINATION:  VITAL SIGNS:  Temperature 97.6; pulse 73;  respirations 20; blood pressure 113/72; O2 sat is 98% on room air.  CHEST:  Clear.  HEART:  Regular.  ABDOMEN:  Soft.  EXTREMITIES:  No edema.   ASSESSMENT:  She is about the same.  She says that she wants to try to get  up today.  She is concerned about her leg swelling, so I am going to go  ahead and see if we can get some TED hose, perhaps to help with compressive  reduction of her swelling.  Her prothrombin time is 18.2 with an INR of 1.8.  She is therapeutic now, so at this point, as soon as she is ready from her  other aspects, she could be discharged home from a clotting aspect.      ___________________________________________                                            Oneal Deputy. Juanetta Gosling, M.D.   ELH/MEDQ  D:  12/24/2003  T:  12/24/2003  Job:  [id number]

## 2010-12-25 NOTE — Consult Note (Signed)
NAME:  [name], [name]                        ACCOUNT NO.:  [id number]   MEDICAL RECORD NO.:  [id number]                   PATIENT TYPE:  INP   LOCATION:  A325                                 FACILITY:  [facility]   PHYSICIAN:  Edward L. Juanetta Gosling, M.D.             DATE OF BIRTH:  [birth year]   DATE OF CONSULTATION:  DATE OF DISCHARGE:                                   CONSULTATION   REFERRING PHYSICIAN:  Scott A. Gerda Pena, M.D.   REASON FOR CONSULTATION:  Pulmonary embolus.   HISTORY:  [name] is a 30 year old who was admitted with pulmonary  embolism. She says that she had been feeling fairly well then developed  marked increase in leg pain in her lower left leg.  Eventually this was  worked up and she had pulmonary emboli and thrombophlebitis by CT scan.  She  was also noted to have a pulmonary infarction on the CT scan.  She has had a  previous history of iron deficiency anemia because of menorrhagia.  She has  had a history of gastroesophageal reflux disease and she has been anemic.  She has been hospitalized in the past for pneumonia.  She denies any cough,  sputum production, fever or chills, but says that she is mildly short of  breath.  Her major complaint now is of severe left lower leg pain.   PAST MEDICAL HISTORY:  Her past medical history is as mentioned.  She has  had a D&C done about a year ago.   MEDICATIONS:  Her medications are Heparin.  At home she has been on Protonix  and iron.   SOCIAL HISTORY:  She smokes about a pack of cigarettes a day.  She also does  take Premarin tablets.   FAMILY HISTORY:  Positive for coronary disease, she says for blood clotting  problems.   PHYSICAL EXAMINATION:  GENERAL:  Physical exam shows a somewhat obese female  who is in no acute distress, but is complaining of pain in her leg.  CHEST:  Her chest is actually fairly clear without wheezes, rales or  rhonchi.  HEART:  Heart is regular without murmur, gallop, or rub.  ABDOMEN:  Her  abdomen is soft. She does not have any friction rubs.  EXTREMITIES:  Her leg, though is markedly swollen and tender.  HEENT:  Unremarkable.   ASSESSMENT:  She has pulmonary embolism.  Although Heparin certainly is  effective I am generally more in favor of using Lovenox.  As far as her  pulmonary infarction goes, as long as she does not develop fever, etcetera  there is no reason to put her in antibiotics; but, if she does get fever,  that would be the drug of choice.  I am going to go ahead and also ask for  continuous warm compresses to her leg.      ___________________________________________  Edward L. Juanetta Gosling, M.D.   ELH/MEDQ  D:  12/19/2003  T:  12/19/2003  Job:  [id number]   cc:   Lorin Picket A. Gerda Pena, M.D.  [address], [address]  [place]  [place] [address]  Fax: [phone]

## 2010-12-25 NOTE — Discharge Summary (Signed)
NAMEBRYANNE, [name]                        ACCOUNT NO.:  [id number]   MEDICAL RECORD NO.:  [id number]                   PATIENT TYPE:  INP   LOCATION:  A306                                 FACILITY:  [facility]   PHYSICIAN:  Donna Bernard, M.D.             DATE OF BIRTH:  08/16/1980   DATE OF ADMISSION:  07/18/2003  DATE OF DISCHARGE:  07/22/2003                                 DISCHARGE SUMMARY   FINAL DIAGNOSES:  1. Right middle lobe pneumonia.  2. Bronchospasm secondary to right middle lobe pneumonia.  3. Iron deficiency anemia.   FINAL DISPOSITION:  1. The patient is discharged home.  2. Discharge medications:     A. Levaquin 500 mg daily for six days.     B. Ventilator 2 sprays q.4h. as needed.     C. Increase iron tablets to twice per day with food.  3. Follow up in the office early next week.   INITIAL H&P:  Please see H&P as dictated.   HOSPITAL COURSE:  This patient is a 30 year old white female with a history  of iron deficiency anemia who presented to the emergency room on the night  of admission with severe fever, chills, and cough.  A CBC revealed  significant anemia.  There was concern about potential need for transfusion.  However, Dr. Milinda Cave appropriately decided against this.  The patient was  admitted to the hospital.  She was given IV fluids for her dehydration along  with IV antibiotics.  For the next 48 hours the patient defervesced.  However, significant wheezing ensued.  On further history the patient has  had one problem with this years ago, none since.  Ventolin was added to the  patient's regimen.  Over the following 48 hours this slowly resolved.  Today, the day of discharge, the patient is feeling better.  She has had no  significant fever for the past couple of days.  Her bronchospasm has abated.  She is discharged home with the diagnoses and disposition as noted above.     ___________________________________________               Donna Bernard, M.D.   WSL/MEDQ  D:  07/22/2003  T:  07/22/2003  Job:  [id number]

## 2010-12-25 NOTE — Group Therapy Note (Signed)
NAME:  [name], [name]                        ACCOUNT NO.:  [id number]   MEDICAL RECORD NO.:  [id number]                   PATIENT TYPE:  INP   LOCATION:  A214                                 FACILITY:  [facility]   PHYSICIAN:  Edward L. Juanetta Gosling, M.D.             DATE OF BIRTH:  1980-11-30   DATE OF PROCEDURE:  DATE OF DISCHARGE:                                   PROGRESS NOTE   PROBLEM:  Pulmonary embolism   SUBJECTIVE:  [name] says she is feeling a little better.  She has no new  complaints.  She is able to move her leg a little bit.  She said she was  able to bear some weight on her leg today which is different.  Otherwise,  she says things are going pretty well.  She denies any other new complaint.   OBJECTIVE:  Her exam today shows that her chest is clear.  Her heart is  regular.  Her abdomen is soft.  Extremities showed no edema.  She does say  that she has asthma, and I think we should go ahead and give her some  nebulizer treatments and see if that will make a difference.  She says that  she feels like she will be able to get up and move around if someone can  help with her leg.   ASSESSMENT/PLAN:  I have shown her some exercises to try to do in the  meantime.  At this point, I would not change anything that we are doing as  far as her treatment plan.  I would continue with Coumadin, continue with  the Lovenox.  He hemoglobin now is up to 8.3. Prothrombin time is 16.3, but  she is not quite therapeutic.      ___________________________________________                                            Oneal Deputy. Juanetta Gosling, M.D.   ELH/MEDQ  D:  12/22/2003  T:  12/22/2003  Job:  [id number]

## 2010-12-25 NOTE — H&P (Signed)
NAME:  [name], [name]                        ACCOUNT NO.:  [id number]   MEDICAL RECORD NO.:  [id number]                   PATIENT TYPE:  INP   LOCATION:  A325                                 FACILITY:  [facility]   PHYSICIAN:  Scott A. Gerda Diss, M.D.               DATE OF BIRTH:  1981-02-21   DATE OF ADMISSION:  12/18/2003  DATE OF DISCHARGE:                                HISTORY & PHYSICAL   CHIEF COMPLAINT:  Left leg swollen, tender, pain.   HISTORY OF PRESENT ILLNESS:  This is a 30 year old white female who presents  to the emergency department after having significant pain and discomfort.  She states that she was treated for a urinary tract infection earlier in the  week.  Seemingly was starting to get better with that but then started  noticing some slight tingling sensation in her leg on the afternoon of the  11th, then late in the night of the 11th, started noticing swelling in the  leg along with pain and discomfort, then in the early morning hours severe  pain.  She went to the emergency department.  She denied any chest  tightness, pressure, pain, or shortness of breath.  She had been treated for  cough and congestion and left-sided chest pain/discomfort back in April.  Patient is on hormone medications and is also overweight.  She does not  smoke.  The patient denies any fevers or chills today or yesterday.  No  vomiting or diarrhea.   PAST MEDICAL HISTORY:  1. Genital warts.  2. Obesity.  3. Dysmenorrhea and menorrhagia.  4. Anemia secondary to menorrhagia.   MEDICATIONS:  On birth control pills as well as iron tablets.   SOCIAL HISTORY:  Stopped smoking last year.   FAMILY HISTORY:  She relates DVTs in her dad and also grandparent.   ALLERGIES:  None.   REVIEW OF SYSTEMS:  Per above.   PHYSICAL EXAMINATION:  GYN:  NAD.  LUNGS:  CTA.  Not tachypneic.  HEART:  Regular.  ABDOMEN:  Soft.  EXTREMITIES:  A large, swollen left lower leg with tenderness in the thigh  region.  Extremities otherwise looked good.   VVT/CT scan of the chest and legs show a DVT along with a pulmonary embolus  along with left lower lobe infarction due to the pulmonary embolus.  She  needs to be treated with Lovenox as well as be kept at bedrest for the  next several days.  Will start Coumadin tomorrow.  Lab work overall looks  good.  Patient was talked to at length regarding this.  Placed on 2 liters  of O2 per nasal cannula as a general precaution.   DIAGNOSES:  Pulmonary embolus:  See per above.     ___________________________________________  Scott A. Gerda Diss, M.D.   SAL/MEDQ  D:  12/19/2003  T:  12/19/2003  Job:  [id number]

## 2010-12-25 NOTE — Op Note (Signed)
NAMEJANDI, [name]                        ACCOUNT NO.:  [id number]   MEDICAL RECORD NO.:  [id number]                   PATIENT TYPE:  INP   LOCATION:  A420                                 FACILITY:  [facility]   PHYSICIAN:  Langley Gauss, M.D.                DATE OF BIRTH:  11/25/1980   DATE OF PROCEDURE:  09/20/2002  DATE OF DISCHARGE:                                 OPERATIVE REPORT   PREOPERATIVE DIAGNOSES:  1. Irregular menses.  2. Menorrhagia.  3. Anemia secondary to blood loss.   PROCEDURE:  Fractional dilatation and curettage.   SURGEON:  Langley Gauss, M.D.   ESTIMATED BLOOD LOSS:  50 mL.   SPECIMENS:  Large amount of polypoid-appearing endometrial curettings to  pathology.  In addition, endocervical curettings obtained.   ANESTHESIA:  General by endotracheal.   FINDINGS:  Diffusely enlarged uterus, which sounds to a depth of 10 cm.  Large tissue obtained.   DESCRIPTION OF PROCEDURE:  The patient was taken to the operating room.  Vital signs were stable.  The patient underwent uncomplicated induction of  general endotracheal anesthesia, after which time she was placed in the  dorsal lithotomy position, prepped and draped in the usual sterile manner.  Speculum examination was performed, which reveals the cervix to be without  lesions.  Small clots were evacuated from the posterior vaginal fornix.  Anterior lip of the cervix is grasped with a single-tooth tenaculum.  The  uterus is noted to sound to a depth of 10 cm, noted to be anteflexed.  Gentle progressive dilatation is then performed up to a size #15 Hegar  dilator, which allows passage of a banjo curette.  Initially the  endocervical area is curettaged, followed by aggressive banjo curettage of  the entire uterine cavity until an appropriate gritty feeling and sensation  was appreciated in all quadrants of the uterus, to include the uterine  fundus.  Following the procedure the intrauterine walls were noted  to be  smooth in contour with no evidence of any fibroids impinging on the mucosa.  A large amount of tissue is obtained, which is sent off as a separate  specimen.  No active bleeding is noted to occur.  There are no complications  with the procedure.  The procedure is then terminated and the patient is to  be reversed from anesthesia, after which time operative findings discussed  with the patient's awaiting family.  The patient is treated postoperatively  with 1 g IV Ancef as well as 60 mg of IM Toradol for pain relief.                                               Langley Gauss, M.D.   DC/MEDQ  D:  09/21/2002  T:  09/21/2002  Job:  [id number]  

## 2010-12-25 NOTE — Discharge Summary (Signed)
NAMEEVELINA, LORE              ACCOUNT NO.:  [id number]   MEDICAL RECORD NO.:  [id number]          PATIENT TYPE:  INP   LOCATION:  9122                          FACILITY:  [facility]   PHYSICIAN:  Charles A. Delcambre, MDDATE OF BIRTH:  1981/01/27   DATE OF ADMISSION:  07/21/2008  DATE OF DISCHARGE:  07/29/2008                               DISCHARGE SUMMARY   PRIMARY DISCHARGE DIAGNOSES:  1. Intrauterine pregnancy at 39 weeks and 3 days.  2. History of pulmonary embolism and deep venous thrombosis.  3. Fetal intolerance of labor.  4. Skin lesion.   ADDITIONAL DIAGNOSIS:  Pelvic adhesive disease.   PROCEDURE:  Resection of skin lesion.   PROCEDURE:  Primary low transverse cesarean section, lysis of adhesions,  and skin lesion resection.   OPERATIVE FINDINGS:  Multiple omental adhesions to the anterior  abdominal wall were noted.  The cord arterial blood gas 7.31, venous  blood gas 7.37, placenta to pathology, vigorous female, Apgars of 8 and 9,  birth weight 3760 g, 50.8 cm length.   HISTORY AND PHYSICAL:  Was dictated, but was not found on the chart.  She was admitted to the floor management with Lovenox changed to heparin  and was to discontinue heparin when she became active.  She had an SROM  at 02100 Cervidil fell out, Pitocin was started with penicillin for  positive group B strep prophylaxis.  Cervix was 3 cm at that time I  noted at 1305 that I saw from the office on the fetal heart tracing,  some late decelerations and moderate variables and tachycardia versus  erratic reactivity.  I recommend Pitocin to be discontinued.  IV fluid  to be given and came to [facility] immediately.  Approximately 10  minutes from arriving, the patient taken to the OR for nonreassuring  fetal heart rate.  Pitocin had been discontinued when rupture of  membranes occurred.  It was questioned that she had an allergic reaction  to Nubain with swollen lips, resolved with Benadryl.  Postop day  #1, she  did well and we restarted Coumadin 10 mg that day, and then would go to  5 mg once a day.  She was started on Lovenox per pharmacy dosing.  She  had an episode of shortness of breath and tachypnea, did have lower  extremity Doppler.  Benign findings were noted.  On postop day #2, she  continued on Lovenox and Coumadin.  On postop day #3, PT was 15.8, INR  1.2.  Surgically, she was doing okay otherwise.  Postop day #5,  continued with INR of 1.8, Lovenox 150 mg q.12 h.  On postop day #6, INR  was 2.1, high.  She was then discharged home per Dr. Richardson Dopp with Percocet,  Motrin, iron, Coumadin 5 mg a day, and heparin discontinued, to follow  up with primary care doctor in [place], Dr. Gerda Diss, otherwise to  follow up in the office in 10 days with staples to be discontinued on  day of discharge.  She was given convalescent instructions, prescription  of Percocet and Motrin, 800 mg iron, Coumadin.  Charles A. Sydnee Cabal, MD  Electronically Signed    CAD/MEDQ  D:  08/24/2008  T:  08/25/2008  Job:  [id number]

## 2010-12-25 NOTE — H&P (Signed)
NAME:  [name], [name]                        ACCOUNT NO.:  [id number]   MEDICAL RECORD NO.:  [id number]                   PATIENT TYPE:   LOCATION:                                       FACILITY:  [facility]   PHYSICIAN:  Langley Gauss, M.D.                DATE OF BIRTH:  [birth year]   DATE OF ADMISSION:  09/19/2002  DATE OF DISCHARGE:                                HISTORY & PHYSICAL   HISTORY OF PRESENT ILLNESS:  This is a 30 year old gravida 0, para 0,  actively bleeding on today's date, who is referred to [facility] on  observation status for management of irregular and prolonged menses with  resultant anemia.  The patient did have a fingerstick hemoglobin of 6.4 on  September 18, 2002 and on today's date, hemoglobin stick is 7.6.  The patient  is continuing to very heavy vaginal bleeding at present, initially it lasted  x3-1/2 months' duration and thereafter, spontaneously subsided times several  days' duration.  The patient was seen in Dr. Fletcher Anon office, at which time  she was started on [facility] 1/35.  The patient, however, now states that during  the past three days she has again had resumption of very heavy vaginal  bleeding with passage of large clots and cramping.   Pertinently, the patient does have a longstanding history of irregular  menses, stating her menses have been irregular since menarche.  She has  previously been on birth control pills and on Depo-Provera.  Most  pertinently, the patient was maintained on birth control pills up until  February 2002, patient on birth control pills times greater than one and  half year's duration, and she states they failed to give her any significant  relief from her bleeding problems and that she bled very irregularly, even  on the pills.  Thereafter, in February 2002, the patient was treated with  Depo-Provera injections x1 year's time, with her last Depo-Provera injection  being February 2003.  The patient states that on  the Depo, she did have less  heavy cycles, but she continued to experience irregular bleeding.  In  addition, she experienced weight gain on the Depo-Provera, thus she  discontinued its use.  Thus, she has not been on any kind of hormonal  control of her menses until just recent visit to [facility] on  September 12, 2002.   PAST MEDICAL HISTORY:  The patient was admitted x1 as a child in 1998, at  which time she spontaneously passed a kidney stone.  She has no other  medical or surgical history.   FAMILY HISTORY:  Family history is pertinent for diabetes.  She states that  both her mother and sister have had hysterectomies before.   CURRENT MEDICATIONS:  Necon 1/35 and over-the-counter iron pills.   ALLERGIES:  No known drug allergies.   PHYSICAL EXAMINATION:  GENERAL:  This is an obese female.  Height is 5 feet  5 inches.  Weight 303.  VITAL SIGNS:  Blood pressure 132/82, pulse rate of 80, respiratory rate of  20.  HEENT:  Negative.  NECK:  No adenopathy.  Neck is supple.  Thyroid is nonpalpable.  LUNGS:  Lungs are clear.  CARDIOVASCULAR:  Exam is regular rate and rhythm.  ABDOMEN:  The abdomen is soft and nontender.  No surgical scars are  identified.  EXTREMITIES:  Extremities are noted to be normal.  PELVIC:  Exam reveals normal external genitalia.  No lesions or ulcerations  are identified.  No masculinization is identified.  Sterile speculum  examination reveals the cervix to be without lesions, however, there is a  golf-ball-size clot present at the endocervical os and there appears to be  some ongoing active vaginal bleeding.  Bimanual examination reveals a uterus  and ovaries which are noted to be nonpalpable.  Secondary to morbid obesity,  no enlarged pelvic masses are identified.   LABORATORY STUDIES:  An ultrasound had been obtained at [facility]  dated September 12, 2002 which revealed a normal-sized uterus with a very  thick endometrial stripe at  14.2 mm; in addition, there was a subserosal  pedunculated fibroid identified, measured at 6.6 x 5.0 x 5.6 cm in maximum  diameter.   ASSESSMENT:  Morbidly obese female, likely due to chronic anovulatory cycles  and polycystic ovaries.  At present, the patient has longstanding history of  irregular menses and most pertinently, very heavy vaginal bleeding x3-1/2  months' duration with resultant anemia.  The patient is noted to be actively  bleeding on today's date and states that one day previously, she was  markedly symptomatic with feelings of dizziness and faintness, like she was  going to fall out, thus this is somewhat of an emergent problem at present.  As she will not tolerate continued heavy blood loss, she is referred to  [facility] on an observation status.  She will be rehydrated with  intravenous fluids, laboratory studies will be reassess and the patient will  be treated with Premarin 25 mg intravenously q.6h. to stabilize the  endometrium and stop the active vaginal bleeding.  Thereafter, her  hemoglobin can be assessed to ascertain whether transfusion will be required  and the patient has been added onto the operating room schedule for September 20, 2002 for performance of a diagnostic and therapeutic dilatation and  curettage.  The patient is well-aware of the pedunculated fibroid that is  present but this seems to be a separate problem than the ongoing anemia and  active vaginal bleeding, thus, due to missed time from work and disability,  the patient prefers at this time to only proceed with the emergent  dilatation and curettage and return to the operating room at a later time  for removal of the pedunculated fibroid.                                               Langley Gauss, M.D.    DC/MEDQ  D:  09/19/2002  T:  09/19/2002  Job:  [id number]

## 2010-12-25 NOTE — Discharge Summary (Signed)
[name], [name]              ACCOUNT NO.:  [id number]   MEDICAL RECORD NO.:  [id number]          PATIENT TYPE:  INP   LOCATION:  9303                          FACILITY:  [facility]   PHYSICIAN:  Charles A. 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   PROCEDURE:  Exploratory laparotomy, left salpingo-oophorectomy.   DISPOSITION:  The patient was discharged home to follow up in the office  in 72 hours to have staples discontinued as well as to check a PT/PTT,  INR.  She is given convalescent instructions to rest at home, no lifting  greater than 25 pounds for four weeks, no driving for two weeks, no bath  but shower okay for two weeks.  She was to call for any temperature over  100 degrees, increased vaginal bleeding (on her period now, which the  degree of bleeding is routine for her), or increased pain, incision  redness or drainage, chest pain, shortness of breath or leg pain.  She  was given prescriptions for Ambien 10 mg one p.o. q.h.s. p.r.n., #14;  heparin 5000 units subcutaneous q.8h. quantity sufficient for one week  with one refill, heparin syringes quantity sufficient; Hemocyte one p.o.  daily, #30; Percocet 5/325 mg one to two p.o. q.4h. p.r.n., #40.  She  agrees for instructions and will follow up as directed.   History and physical as dictated and on the chart.   PATHOLOGY:  Left tube and ovary went to pathology, and pathology  pending.   LABORATORY DATA:  Postoperative hemoglobin 10.0, hematocrit 30.4.   HOSPITAL COURSE:  The patient was admitted and underwent surgery as  noted above.  Postoperatively approximately 4 hours postop secondary to  positive anticardiolipin antibody and  history of pulmonary embolus, she  was started on heparin subcutaneous.  On postop day #1 she was given 5  mg of Coumadin as well.  She continued on the heparin and Coumadin.  On  postop day #2 she was given 10 mg of Coumadin and she was voiding  without difficulty.  Postop day #1, pain was controlled, she was on p.o.  Percocet after the PCA was discontinued.  She tolerated a diet, had  positive flatus, no nausea or vomiting, and was on general diet.  On  postop day #3 she was voiding, eating well, pain was tolerated.  The  only problem was her period had started, but that was not heavy or  different, and for this reason she is discharged home to continue  heparin and Coumadin recheck coagulation studies in 72 hours when she  presents for staple removal.  All questions were answered and she will  follow up as directed.      Charles A. Sydnee Cabal, MD  Electronically Signed     CAD/MEDQ  D:  10/14/2006  T:  10/14/2006  Job:  [id number]

## 2010-12-25 NOTE — H&P (Signed)
NAME:  [name], [name]                        ACCOUNT NO.:  [id number]   MEDICAL RECORD NO.:  [id number]                   PATIENT TYPE:  INP   LOCATION:  A306                                 FACILITY:  [facility]   PHYSICIAN:  Jeoffrey Massed, M.D.             DATE OF BIRTH:  1981/05/14   DATE OF ADMISSION:  07/18/2003  DATE OF DISCHARGE:                                HISTORY & PHYSICAL   CHIEF COMPLAINT:  Fever and cough.   HISTORY OF PRESENT ILLNESS:  [name] is a 30 year old white female who was  feeling in her normal state of health up until 2 days prior to arrival in  the emergency department.  At that time she developed a dry cough.  She  received an antibiotic from her outpatient caregiver and gradually began  developing body aches and fever to 102 orally.  She had also had gradual  onset of sore throat and pain in her chest.  The cough occasionally produces  some thick mucus, but mostly it is nonproductive.  Today she vomited her  lunch and her dinner, but was able to keep fluids down otherwise. No  diarrhea.  No abdominal pain.  She does feel slightly short of breath. She  does feel fatigued over the last couple of days.   REVIEW OF SYSTEMS:  Positive for chills and sweats, positive for pallor,  positive for an itchy rash on both legs for the last 2 weeks. Negative for  any abdominal pain, negative for pelvic pain or vaginal bleeding, negative  for hematochezia or melena.   PAST MEDICAL HISTORY:  1. Iron-deficiency anemia:  Has a history of prolonged and heavy menses.     LMP ended about 3 days ago and has lasted about 2 weeks.  She remembers     her last hemoglobin, about 3 weeks ago, was 5.2. She has been on daily     iron pill for about the last 6 months.  She has received transfusion of 2     units of packed red blood cells in February 2004 when she had a D&C done     by Dr. Lisette Grinder.  This is her only transfusion.  She denies ever having     symptomatic anemia.  2.  Gastroesophageal reflux disease.  3. Metromenorrhagia.  4. History of pedunculated fibroid about 14 cm in size.   PAST SURGICAL HISTORY:  Dilatation and curettage in Febrile 2004.   MEDICATIONS:  1. Protonix 40 mg p.o. daily.  2. Theogen Forte 1 tab daily.  3. Permethrin 5% cream applied to the legs daily p.r.n.  4. Cefuroxime 250 mg b.i.d. for the last 2 days.   ALLERGIES:  No known drug allergies.   SOCIAL HISTORY AND HABITS:  The patient is single and lives with her  boyfriend.  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  She has a  history of tobacco abuse, but quit  1 year ago.  No alcohol intake.  She also  has a history of marijuana use up until 1 year ago.  She denies any other  illicit drug use.   FAMILY HISTORY:  Coronary artery disease. Her father had his first MI at age  79 and died of coronary artery disease.  Cerebrovascular disease in father  and grandmother.  Breast cancer in maternal great-grandmother, and diabetes  in paternal grandfather and paternal grandmother.   PHYSICAL EXAMINATION:  VITAL SIGNS:  Temperature 98.3, pulse 93, blood  pressure 152/75, respiratory rate 20, weight 303 pounds.  O2 saturation 98%  on room air.  GENERAL:  A pleasant, tired appearing, in no apparent distress.  Appears  obese.  HEENT:  Pupils are equal, round, and reactive to light and accommodation.  There is no sclerae icterus, pallor, or injection.  Tympanic membranes with  good light reflex and landmarks bilaterally.  Nasal passages patent  bilaterally.  Oropharynx with pink, moist mucosa without erythema, exudate  or swelling.  NECK:  Neck is supple without palpable lymphadenopathy or thyromegaly.  Redundant fatty tissue noted.  LUNGS:  Lungs are clear to auscultation bilaterally with mildly decreased  breath sounds in both bases posteriorly.  Breathing is nonlabored though  wheezing.  CARDIOVASCULAR:  Regular rate and rhythm with a 2/6 systolic murmur heard  best  at the upper sternal border.  This murmur is not heard well at the  apex.  ABDOMEN:  Abdomen is soft, rotund, nontender.  Exam limited by obesity, but  no gross hepatosplenomegaly or masses palpated.  Bowel sounds are  normoactive.  EXTREMITIES:  No clubbing, cyanosis, or edema.  SKIN:  Slight pallor. There is excoriated maculopapular rash on the inner  thighs bilaterally.   LABS:  CBC shows a white blood cell count of 5.4, hemoglobin of 7.8,  platelet count 371.  The MCV is 62.3.  Chest x-ray PA and lateral shows poor  inspiration.  There is increased perihilar markings and peribronchial  thickening noted. There is poor visualization of the right heart border  indicating likely right middle lobe infiltrate.  There is also some  streaky/linear opacity in the right lower lobe region consistent with  infiltrate versus atelectasis.  Diaphragmatic borders are clearly outlined  and heart size is normal.  Complete metabolic panel:  Sodium 139, potassium  4.1, chloride 112, bicarb 26, BUN 7, creatinine 0.7, glucose 101, total  bilirubin 0.3, alkaline phosphatase 40, AST 19, ALT 24, total protein 5.7,  albumin 3.3, calcium 8.4.  The differential on the white blood cell count  was 76% neutrophils, 20% lymphocytes.  Rapid flu pending.   ASSESSMENT AND PLAN:  1. Pneumonia.  No O2 requirement and the patient is in no respiratory     distress.  Will treat as inpatient for now, given poor p.o. intake and     vomiting yesterday.  Will start Tequin 400 mg IV daily.  2. Anemia.  Given her history, I feel, that this is a stable situation and     she has physiologically adapted to this.  I do feel like her present is     exacerbated by this, but I do not think that this is symptomatic anemia     requiring transfusion.  However, will discuss this with her primary     doctor and leave this decision to them, later today.  Her hemoglobin over    the last 3 weeks, per her report, has gone up from 5.2 to 7.8  presently.  3. Will continue iron sulfate in the hospital at 325 mg p.o. b.i.d.  4. Mild dehydration.  This is primarily by history and she does not appear     remarkably dehydrated on exam.  Will give IV fluids until she is taking     p.o. well.  Will give antiemetics p.r.n.     ___________________________________________                                         Jeoffrey Massed, M.D.   PHM/MEDQ  D:  07/18/2003  T:  07/18/2003  Job:  [id number]

## 2010-12-25 NOTE — Discharge Summary (Signed)
[name], [name]                        ACCOUNT NO.:  [id number]   MEDICAL RECORD NO.:  [id number]                   PATIENT TYPE:  INP   LOCATION:  A420                                 FACILITY:  [facility]   PHYSICIAN:  Langley Gauss, M.D.                DATE OF BIRTH:  October 21, 1980   DATE OF ADMISSION:  09/19/2002  DATE OF DISCHARGE:  09/21/2002                                 DISCHARGE SUMMARY   The patient initially presented as an observation status on 09/19/02. She  required admission on 09/20/02 which is also the date of the dilatation and  curettage. The patient was discharged to home on 09/21/02. She was a copy of  standardized discharge instructions; follow up in the office in one week's  time.   DISCHARGE MEDICATIONS:  1. Doxycycline 100 mg p.o. b.i.d. for 10 days.  2. Vicoprofen one p.o. q.6h. p.r.n. uterine cramping #20 with no refill.   PROCEDURE PERFORMED:  Dilatation and curettage for diagnostic and  therapeutic purposes. Final pathology is currently pending.   PERTINENT LABORATORY DATA:  A+ blood type, serum HCG is negative. Initial  hemoglobin 6.9, hematocrit 22.9 with a white count of 8.9, platelet count  290,000, MCV consistent with iron-deficiency anemia at 63.2. Electrolytes  within normal limits. BUN and creatinine within normal limits. Random  glucose slightly elevated at 160.   HOSPITAL COURSE:  The patient was seen and evaluated in the office on  09/19/02 at which time she was noted to be complaining of irregular and  prolonged menses, most recently lasting three and one half months' duration.  The patient was noted to be anemic with a finger-stick hemoglobin of 6.4 on  2/10. Repeat finger stick in the office on 09/19/02 was 7.6. The patient was  noted to have active vaginal bleeding at the time of evaluation on 09/19/02.  Thus, it was emergent that she would be referred for treatment to correct  the symptomatic anemia as well control the active vaginal  bleeding. The  patient initially referred as an observation status on 09/19/02. She was  treated with Premarin 25 mg IV q.6h. in an effort to stop the active  bleeding, and she was transfused with 2 units of packed red blood cells in  anticipation of dilatation and curettage. After the first unit of blood, the  patient's hemoglobin had improved to 7.9 with a hematocrit of 25.1. She  received a second unit of blood on 09/20/02 with resultant hemoglobin 9.4,  hematocrit 30.1 immediately preoperatively. The patient was then taken to  the operating room on 09/20/02 at which time the dilatation and curettage was  performed. Significant decrease in active vaginal bleeding was noted at the  time of the dilatation and curettage, and certainly at the completion of  this procedure, no active bleeding was noted to be occurring. The patient  was thereafter referred back to her room on the fourth floor  on 09/20/02 with  expectation she could be discharged to home on that date, still under an  observation status; however, the patient had significant nausea and vomiting  upon returning to the fourth floor. Thus, it was required that she be  admitted for continued evaluation and intravenous fluids were continued. The  patient likewise was treated with antiemetics. The patient did well on this  regimen and evaluated late in the p.m. on 09/20/02, was heavily sedated,  complained of no significant bleeding, and had not had a recurrence of the  nausea and vomiting. She slept well during the night with no significant  onset of vaginal bleeding, and upon evaluation on 09/21/02, she was  discharged to home. Most pertinently, she will continue her current pack of  birth control pills, Necon 135, in an effort to provide cyclical menses and  avoid development of the thickened tissue which had been noted resulting in  this bleeding episode.   DISPOSITION:  The patient will follow up in one week's time at which time we   can discuss the pathology results. She was noted to have a pedunculated  fibroid on ultrasound, maximum diameter of 6 x 5.5 cm in diameter which  ultimately will require surgical removal. This current hospitalization was  for care of the emergency situation of the active vaginal bleeding with  resultant anemia.                                               Langley Gauss, M.D.    DC/MEDQ  D:  09/21/2002  T:  09/21/2002  Job:  [id number]

## 2010-12-25 NOTE — H&P (Signed)
[name], [name]              ACCOUNT NO.:  [id number]   MEDICAL RECORD NO.:  [id number]          PATIENT TYPE:  AMB   LOCATION:  SDC                           FACILITY:  [facility]   PHYSICIAN:  Charles A. Delcambre, MDDATE OF BIRTH:  [birth year]   DATE OF ADMISSION:  10/11/2006  DATE OF DISCHARGE:                              HISTORY & PHYSICAL   She is to be admitted on October 11, 2006, to undergo abdominal myomectomy  for menorrhagia.   She is a 30 year old gravida 0, para 0-0-0-0, previously suppressed on  megestrol for known large fibroid uterus but with a history of DVT and  pulmonary embolus in the past, I recommended she not take the megestrol  which increases this risk.  She is on Micronor, this has failed to  suppress as well.  Most recently, multiple fibroids were seen on CT  urogram.  In the past up to three years, it had been up to 12 cm, then  down to 8 cm, but now is noted to be 17 cm.  On ultrasound as well as  CT, the fibroid is felt to be large and pedunculated from the fundal  region.  She also has cramping and pressure developed now with growth of  the fibroid and very heavy periods and clots and hygienic accidents.   PAST MEDICAL HISTORY:  1. Factor V Leiden positive.  2. Unknown heterogenous or heterozygous.  3. Family history very strong for DVT in her father, passing away from      embolus.  4. Asthma.  5. Endometriosis presumed, diagnosed at age 2.  6. Anemia felt secondary to menorrhagia.   PAST SURGICAL HISTORY:  D&C in the past for thickened endometrial lining  with menorrhagia, benign findings.   MEDICATIONS:  Megestrol and Micronor.   ALLERGIES:  NO KNOWN DRUG ALLERGIES.   SOCIAL HISTORY:  She smokes 2-3 cigarettes a day.  Otherwise, denying  drug abuse or STDs in the past.  She is sexual active, not married but  monogamous and uses condom.   FAMILY HISTORY:  Father deceased at 31, heart disease, diabetes, factor  V Leiden, venous  thrombosis/pulmonary embolus leading to death.  Mother  age 67 with fibroids but otherwise good health.  Brother 16, good  health.  Otherwise negative family history for major illnesses.   REVIEW OF SYSTEMS:  Denies fever or chills, rashes or lesions,  headaches, dizziness.  She does have some seasonal allergies.  No chest  pain, shortness of breath or wheezing.  No diarrhea, constipation,  bleeding, melena, hematochezia, urgency, frequency, dysuria,  incontinence, hematuria, galactorrhea or emotional changes.   PHYSICAL EXAMINATION:  GENERAL APPEARANCE:  Alert and oriented x3.  Obesity complicating exam.  VITAL SIGNS:  Respirations 20, pulse 72, blood pressure 130/82.  HEENT:  Grossly within normal limits.  NECK:  Supple without thyromegaly or adenopathy.  LUNGS:  Clear bilateral.  CARDIOVASCULAR:  Regular rate and rhythm without murmurs, rubs, or  gallops.  BREASTS:  Pendulous, symmetrical, otherwise deferred.  ABDOMEN:  Obesity is present complicating exam.  Uterus is palpable up  to the umbilicus, irregular,  consistent with uterine leiomyomata.  PELVIC:  Limited uterus, 9.2 x 7.5 x 8.7 on ultrasound.  Ovaries appear  normal on ultrasound.  Fibroid as noted above.  At that time, 12.3 cm  pedunculated.  CT urogram did recently show this to be up to 17 cm.  EXTREMITIES:  Nontender without edema, negative Homan's bilaterally.   ASSESSMENT:  1. Fibroid uterus, 218.9.  2. Menorrhagia, 626.2.  3. History of deep venous thromboses and pulmonary embolus.   PLAN:  Abdominal myomectomy.  She gives informed consent, accepts the  risks of infection, bleeding, bowel and bladder damage, blood product  transfusion including hepatitis and HIV exposure.  She understands the  high risk of the surgery but has failed medical management of her  bleeding and fibroid.  Surgery will increase her risk of a DVT and a  pulmonary embolus even so that she has had these in the past, this would  further  increase her risk.  She is known Factor V Leiden positive,  further increasing her risk in this regard.  She wishes to proceed with  surgery, accepting these risks.   She will be n.p.o. past midnight the evening prior to surgery on October 11, 2006, stop megestrol, continue Micronor until the day of surgery.  Type and screen, CBC, urine pregnancy test the day of admission, CMET  all will be done.  SCDs thigh high will be used perioperatively.  We  will plan to begin Lovenox the evening prior to surgery for  anticoagulation and begin her on Coumadin for three months  postoperatively.  She gives informed consent in this regard and we will  proceed as outlined.      Charles A. Sydnee Cabal, MD  Electronically Signed     CAD/MEDQ  D:  10/07/2006  T:  10/07/2006  Job:  [id number]

## 2010-12-25 NOTE — Discharge Summary (Signed)
[name], RADFORD                        ACCOUNT NO.:  [id number]   MEDICAL RECORD NO.:  [id number]                   PATIENT TYPE:  INP   LOCATION:  A214                                 FACILITY:  [facility]   PHYSICIAN:  Donna Bernard, M.D.             DATE OF BIRTH:  07/02/1981   DATE OF ADMISSION:  12/18/2003  DATE OF DISCHARGE:  12/25/2003                                 DISCHARGE SUMMARY   FINAL DIAGNOSES:  1. Pulmonary embolus.  2. Anticoagulation.  3. Anemia.  4. Dysfunctional uterine bleeding.   FINAL DISPOSITION:  1. The patient is discharged home.  2. Coumadin 5 mg two tablets daily.  3. One iron tablet b.i.d.  4. Daily multivitamin with folic acid.  5. Follow-up INR on Friday.  6. Follow-up in the office in one week.  7. Follow-up with Dr. Despina Hidden as scheduled.   INITIAL HISTORY AND PHYSICAL:  Please see H&P as dictated.   HOSPITAL COURSE:  This patient is a 30 year old white female with a history  of dysfunctional uterine bleeding and prior hormonal treatment for this who  presented to the hospital on the day of admission with shortness of breath  and dyspnea.  Workup including a scan revealed a pulmonary embolus.  The  patient was anticoagulated with Lovenox.  Pulmonary folks and GYN folks were  consulted.  Please see their reports.  The patient was maintained with  oxygen.  She did have some discomfort with this.  This was treated  parenterally over the next few days.  The patient gradually improved.  Her  hemoglobin remained stable.  The day of discharge, the patient was felt to  be stable and discharged home.   DIAGNOSES AND DISPOSITION:  As noted above.     ___________________________________________                                         Donna Bernard, M.D.   WSL/MEDQ  D:  01/30/2004  T:  01/30/2004  Job:  [id number]

## 2010-12-25 NOTE — Group Therapy Note (Signed)
NAME:  [name], [name]                        ACCOUNT NO.:  [id number]   MEDICAL RECORD NO.:  [id number]                   PATIENT TYPE:  INP   LOCATION:  A214                                 FACILITY:  [facility]   PHYSICIAN:  Edward L. Juanetta Gosling, M.D.             DATE OF BIRTH:  1981-04-02   DATE OF PROCEDURE:  12/21/2003  DATE OF DISCHARGE:                                   PROGRESS NOTE   SUBJECTIVE:  [name] says she is feeling fairly well.  She has no new  complaints.  She denies any nausea, vomiting, or diarrhea.   OBJECTIVE:  Her leg looks better.  She seems a little less sore.   ASSESSMENT AND PLAN:  She does seem to be improving.  I think it is okay for  her to get up and move around today.      ___________________________________________                                            Oneal Deputy. Juanetta Gosling, M.D.   ELH/MEDQ  D:  12/21/2003  T:  12/21/2003  Job:  [id number]

## 2011-04-28 ENCOUNTER — Emergency Department (HOSPITAL_COMMUNITY)
Admission: EM | Admit: 2011-04-28 | Discharge: 2011-04-28 | Disposition: A | Discharge status: Home / Self Care | Payer: Medicaid Other | Attending: Emergency Medicine | Admitting: Emergency Medicine

## 2011-04-28 DIAGNOSIS — Z86711 Personal history of pulmonary embolism: Secondary | ICD-10-CM | POA: Insufficient documentation

## 2011-04-28 DIAGNOSIS — D6859 Other primary thrombophilia: Secondary | ICD-10-CM | POA: Insufficient documentation

## 2011-04-28 DIAGNOSIS — N644 Mastodynia: Secondary | ICD-10-CM | POA: Insufficient documentation

## 2011-05-11 LAB — URINALYSIS, ROUTINE W REFLEX MICROSCOPIC
Bilirubin Urine: NEGATIVE
Glucose, UA: 250 — AB
Hgb urine dipstick: NEGATIVE
Leukocytes, UA: NEGATIVE
Nitrite: NEGATIVE
Specific Gravity, Urine: >1.03 — ABNORMAL HIGH
Urobilinogen, UA: 0.2
pH: 5.5

## 2011-05-11 LAB — BASIC METABOLIC PANEL
CO2: 21
Calcium: 8.8
Chloride: 112
Creatinine, Ser: 0.45
GFR calc Af Amer: >60
Sodium: 137

## 2011-05-11 LAB — BASIC METABOLIC PANEL WITH GFR
BUN: 6
GFR calc non Af Amer: >60
Glucose, Bld: 86
Potassium: 3.6

## 2011-05-11 LAB — CBC
HCT: 34.1 — ABNORMAL LOW
Hemoglobin: 11.3 — ABNORMAL LOW
MCHC: 33
MCV: 78.2
Platelets: 216
RBC: 4.37
RDW: 14.8
WBC: 12.2 — ABNORMAL HIGH

## 2011-05-11 LAB — URINE MICROSCOPIC-ADD ON

## 2011-05-11 LAB — PROTIME-INR
INR: 1
Prothrombin Time: 13.1

## 2011-05-11 LAB — APTT: aPTT: 24

## 2011-05-12 LAB — WET PREP, GENITAL
Clue Cells Wet Prep HPF POC: NONE SEEN
Yeast Wet Prep HPF POC: NONE SEEN

## 2011-05-12 LAB — DIFFERENTIAL
Lymphocytes Relative: 19
Lymphs Abs: 2.2
Monocytes Absolute: 0.6
Monocytes Relative: 6
Neutro Abs: 8.5 — ABNORMAL HIGH
Neutrophils Relative %: 75

## 2011-05-12 LAB — CBC
Hemoglobin: 11.2 — ABNORMAL LOW
RBC: 4.1
WBC: 11.5 — ABNORMAL HIGH

## 2011-05-12 LAB — KLEIHAUER-BETKE STAIN: Quantitation Fetal Hemoglobin: 0

## 2011-05-14 ENCOUNTER — Ambulatory Visit (INDEPENDENT_AMBULATORY_CARE_PROVIDER_SITE_OTHER): Payer: Medicaid Other | Admitting: Family Medicine

## 2011-05-14 ENCOUNTER — Encounter: Payer: Self-pay | Admitting: Family Medicine

## 2011-05-14 ENCOUNTER — Other Ambulatory Visit (INDEPENDENT_AMBULATORY_CARE_PROVIDER_SITE_OTHER): Payer: Medicaid Other | Admitting: Family Medicine

## 2011-05-14 VITALS — BP 150/99 | HR 89 | Temp 97.7°F | Wt 319.0 lb

## 2011-05-14 DIAGNOSIS — N63 Unspecified lump in unspecified breast: Secondary | ICD-10-CM

## 2011-05-14 DIAGNOSIS — N6019 Diffuse cystic mastopathy of unspecified breast: Secondary | ICD-10-CM

## 2011-05-14 DIAGNOSIS — E669 Obesity, unspecified: Secondary | ICD-10-CM

## 2011-05-14 LAB — CBC
HCT: 32.2 % — ABNORMAL LOW (ref 36.0–46.0)
HCT: 33.3 % — ABNORMAL LOW (ref 36.0–46.0)
HCT: 33.5 % — ABNORMAL LOW (ref 36.0–46.0)
HCT: 35.2 % — ABNORMAL LOW (ref 36.0–46.0)
Hemoglobin: 10.6 g/dL — ABNORMAL LOW (ref 12.0–15.0)
Hemoglobin: 10.7 g/dL — ABNORMAL LOW (ref 12.0–15.0)
Hemoglobin: 11.8 g/dL — ABNORMAL LOW (ref 12.0–15.0)
MCHC: 32.8 g/dL (ref 30.0–36.0)
MCHC: 33 g/dL (ref 30.0–36.0)
MCHC: 33.3 g/dL (ref 30.0–36.0)
MCHC: 33.5 g/dL (ref 30.0–36.0)
MCHC: 33.8 g/dL (ref 30.0–36.0)
MCHC: 33.9 g/dL (ref 30.0–36.0)
MCHC: 34.1 g/dL (ref 30.0–36.0)
MCV: 77.4 fL — ABNORMAL LOW (ref 78.0–100.0)
MCV: 77.9 fL — ABNORMAL LOW (ref 78.0–100.0)
MCV: 78.1 fL (ref 78.0–100.0)
MCV: 78.2 fL (ref 78.0–100.0)
MCV: 78.3 fL (ref 78.0–100.0)
MCV: 78.5 fL (ref 78.0–100.0)
Platelets: 177 10*3/uL (ref 150–400)
Platelets: 199 10*3/uL (ref 150–400)
Platelets: 201 10*3/uL (ref 150–400)
Platelets: 218 10*3/uL (ref 150–400)
RBC: 4.15 MIL/uL (ref 3.87–5.11)
RBC: 4.33 MIL/uL (ref 3.87–5.11)
RBC: 4.52 MIL/uL (ref 3.87–5.11)
RDW: 15.2 % (ref 11.5–15.5)
RDW: 15.4 % (ref 11.5–15.5)
RDW: 15.6 % — ABNORMAL HIGH (ref 11.5–15.5)
RDW: 15.7 % — ABNORMAL HIGH (ref 11.5–15.5)
RDW: 15.8 % — ABNORMAL HIGH (ref 11.5–15.5)
WBC: 12.1 10*3/uL — ABNORMAL HIGH (ref 4.0–10.5)
WBC: 15.6 10*3/uL — ABNORMAL HIGH (ref 4.0–10.5)

## 2011-05-14 LAB — COMPREHENSIVE METABOLIC PANEL
AST: 13 U/L (ref 0–37)
Albumin: 2.7 g/dL — ABNORMAL LOW (ref 3.5–5.2)
Alkaline Phosphatase: 81 U/L (ref 39–117)
BUN: 3 mg/dL — ABNORMAL LOW (ref 6–23)
BUN: 4 mg/dL — ABNORMAL LOW (ref 6–23)
Calcium: 8.4 mg/dL (ref 8.4–10.5)
Creatinine, Ser: 0.48 mg/dL (ref 0.4–1.2)
GFR calc Af Amer: >60 mL/min (ref >60)
GFR calc non Af Amer: >60 mL/min (ref >60)
Glucose, Bld: 88 mg/dL (ref 70–99)
Potassium: 3.8 mEq/L (ref 3.5–5.1)
Sodium: 135 mEq/L (ref 135–145)
Total Protein: 5.2 g/dL — ABNORMAL LOW (ref 6.0–8.3)
Total Protein: 6.2 g/dL (ref 6.0–8.3)

## 2011-05-14 LAB — BLOOD GAS, ARTERIAL
Drawn by: 270521
pO2, Arterial: 63.7 mmHg — ABNORMAL LOW (ref 80.0–100.0)

## 2011-05-14 LAB — PROTIME-INR
INR: 0.9 (ref 0.00–1.49)
INR: 1 (ref 0.00–1.49)
INR: 1 (ref 0.00–1.49)
INR: 1.4 (ref 0.00–1.49)
INR: 2.1 — ABNORMAL HIGH (ref 0.00–1.49)
Prothrombin Time: 12.6 seconds (ref 11.6–15.2)
Prothrombin Time: 12.9 seconds (ref 11.6–15.2)
Prothrombin Time: 13.2 seconds (ref 11.6–15.2)
Prothrombin Time: 13.6 seconds (ref 11.6–15.2)
Prothrombin Time: 15.8 seconds — ABNORMAL HIGH (ref 11.6–15.2)
Prothrombin Time: 20.1 seconds — ABNORMAL HIGH (ref 11.6–15.2)
Prothrombin Time: 25.1 seconds — ABNORMAL HIGH (ref 11.6–15.2)

## 2011-05-14 LAB — RPR: RPR Ser Ql: NONREACTIVE

## 2011-05-14 LAB — LACTATE DEHYDROGENASE: LDH: 145 U/L (ref 94–250)

## 2011-05-14 LAB — TYPE AND SCREEN: ABO/RH(D): A POS

## 2011-05-14 LAB — DIFFERENTIAL
Basophils Relative: 0 % (ref 0–1)
Eosinophils Relative: 0 % (ref 0–5)
Monocytes Absolute: 1 10*3/uL (ref 0.1–1.0)
Monocytes Relative: 7 % (ref 3–12)
Neutro Abs: 12.2 10*3/uL — ABNORMAL HIGH (ref 1.7–7.7)

## 2011-05-14 LAB — APTT: aPTT: 24 seconds (ref 24–37)

## 2011-05-14 LAB — HEPARIN LEVEL (UNFRACTIONATED): Heparin Unfractionated: <0.1 IU/mL — ABNORMAL LOW (ref 0.30–0.70)

## 2011-05-14 LAB — URIC ACID: Uric Acid, Serum: 4.1 mg/dL (ref 2.4–7.0)

## 2011-05-14 LAB — GLUCOSE, CAPILLARY: Glucose-Capillary: 81 mg/dL (ref 70–99)

## 2011-05-20 ENCOUNTER — Encounter: Payer: Self-pay | Admitting: Family Medicine

## 2011-05-20 DIAGNOSIS — N63 Unspecified lump in unspecified breast: Secondary | ICD-10-CM | POA: Insufficient documentation

## 2011-05-20 DIAGNOSIS — E669 Obesity, unspecified: Secondary | ICD-10-CM | POA: Insufficient documentation

## 2011-05-20 NOTE — Assessment & Plan Note (Signed)
While these lumps are likely benign disease, it is worthwhile to investigate the problem given the significant family history of early breast cancer diagnoses. Therefore, I will recommend [name] for diagnostic mammogram and ultrasound. Follow up in 4 weeks.

## 2011-05-20 NOTE — Progress Notes (Signed)
  Subjective:    Patient ID: Gardner Candle, female    DOB: 04/26/1981, 30 y.o.   MRN: [id number]  HPI Ms. Lisenby is a 30 year old female who comes in with a chief complaint of painful masses in her breast bilaterally. She first noticed small discreet tender nodules in her breasts in September. This prompted her to go to [facility] ED, where she was instructed to follow-up with a primary care physician. She denies discharge from her nipples or changes in the skin on her breasts. She denies any palpable masses in her axillae. She denies unintentional weight loss. She notes night sweats.   Ms. Kossman has a family history of breast cancer that includes a diagnosis in her mother at age 42. Additionally, one grandmother was diagnosed at 25 years old. Ms. Gerard has never had imaging of her breast or been diagnosed with breast disease.    Review of Systems  All other systems reviewed and are negative.       Objective:   Physical Exam  Constitutional: She appears well-developed and well-nourished.       Morbidly obese  Cardiovascular: Normal rate and regular rhythm.   Pulmonary/Chest: Right breast exhibits mass and tenderness. Right breast exhibits no inverted nipple, no nipple discharge and no skin change. Left breast exhibits mass and tenderness. Left breast exhibits no inverted nipple, no nipple discharge and no skin change. Breasts are symmetrical.       Multiple discreet nodules in both breast, largest is 1cm x 3 cm   Axillae: No nodules or masses, tenderness to palpation in L. Axilla         Assessment & Plan:  Ms. Campo is an obese 30 year old female without previous primary care who presents with painful breast nodules bilaterally which likely represent benign fibrocystic disease.

## 2011-05-24 ENCOUNTER — Encounter: Payer: Self-pay | Admitting: Family Medicine

## 2011-05-24 NOTE — Progress Notes (Signed)
  Subjective:    Patient ID: [name], female    DOB: 13-May-1981, 30 y.o.   MRN: [id number]  HPI [name] is a 30 year old obese female presenting for pain and masses in her breast bilaterally. She first noticed a mass in her left breast on self exam approximately two weeks ago. She noticed several masses in her left breast and several in her right, most of which were tender. Therefore, she presented to the [facility] ED for evaluation on 04/28/11. At this time, she claims that she was not given a diagnosis and was discharged with the instructions to follow-up with a PCP.   Today the patient notes tenderness in her breast bilaterally that is not relieved with advil and tylenol. She notes three firm, tender masses in her left breast and three in her right as well. She denies a change in size since they were first noticed. She has not had any nipple discharge or changes to the skin. She has not history of trauma or surgery to the breast. She notes that her mother was diagnosed with a form of lung cancer at 70 years old and a grandmother at 75 years old. She does not know the details of the diagnosis or treatment regiment, except that they both had lumpectomies. Another grandmother was diagnosed with breast cancer at age 56.    Review of Systems  Constitutional: Positive for diaphoresis.  HENT: Negative.   Respiratory: Negative.   Cardiovascular: Negative.   Genitourinary: Negative.   Musculoskeletal: Negative.   Neurological: Negative.   Hematological: Negative.        Objective:   Physical Exam  Constitutional:       Morbidly obese, mildly distressed and tearful during the interview and exam  HENT:  Head: Normocephalic and atraumatic.  Mouth/Throat: No oropharyngeal exudate.  Eyes: Pupils are equal, round, and reactive to light.  Neck: Normal range of motion. Neck supple.  Cardiovascular: Normal rate and regular rhythm.   No murmur heard. Pulmonary/Chest: Effort normal and breath  sounds normal. Right breast exhibits mass and tenderness. Right breast exhibits no inverted nipple, no nipple discharge and no skin change. Left breast exhibits mass and tenderness. Left breast exhibits no inverted nipple, no nipple discharge and no skin change. Breasts are symmetrical.       3, 1x2cm firm, well circumscribed tender nodules of left breast  1 firm, well circumscribed tender  nodule of upper outer quadrant of right breast   No nodule present in axillae bilaterally   Lymphadenopathy:    She has no cervical adenopathy.      BP 150/99  Pulse 89  Temp(Src) 97.7 F (36.5 C) (Oral)  Wt 319 lb (144.697 kg)       Assessment & Plan:  [name] presents for her first visit with what appears to be fibrocystic breast disease as well as elevated blood pressure. However, in this setting we will address the blood pressure at a follow-up visit.

## 2011-05-26 ENCOUNTER — Ambulatory Visit
Admission: RE | Admit: 2011-05-26 | Discharge: 2011-05-26 | Disposition: A | Discharge status: Home / Self Care | Payer: Medicaid Other | Source: Ambulatory Visit | Attending: Family Medicine | Admitting: Family Medicine

## 2011-05-26 DIAGNOSIS — N63 Unspecified lump in unspecified breast: Secondary | ICD-10-CM

## 2011-08-09 ENCOUNTER — Emergency Department (HOSPITAL_COMMUNITY)
Admission: EM | Admit: 2011-08-09 | Discharge: 2011-08-09 | Disposition: A | Discharge status: Home / Self Care | Payer: Self-pay | Attending: Emergency Medicine | Admitting: Emergency Medicine

## 2011-08-09 ENCOUNTER — Emergency Department (HOSPITAL_COMMUNITY): Payer: Self-pay

## 2011-08-09 ENCOUNTER — Encounter (HOSPITAL_COMMUNITY): Payer: Self-pay

## 2011-08-09 DIAGNOSIS — E860 Dehydration: Secondary | ICD-10-CM

## 2011-08-09 DIAGNOSIS — R0602 Shortness of breath: Secondary | ICD-10-CM | POA: Insufficient documentation

## 2011-08-09 DIAGNOSIS — Z862 Personal history of diseases of the blood and blood-forming organs and certain disorders involving the immune mechanism: Secondary | ICD-10-CM | POA: Insufficient documentation

## 2011-08-09 DIAGNOSIS — R404 Transient alteration of awareness: Secondary | ICD-10-CM | POA: Insufficient documentation

## 2011-08-09 DIAGNOSIS — S20219A Contusion of unspecified front wall of thorax, initial encounter: Secondary | ICD-10-CM | POA: Insufficient documentation

## 2011-08-09 DIAGNOSIS — R55 Syncope and collapse: Secondary | ICD-10-CM

## 2011-08-09 DIAGNOSIS — Z86718 Personal history of other venous thrombosis and embolism: Secondary | ICD-10-CM | POA: Insufficient documentation

## 2011-08-09 DIAGNOSIS — W19XXXA Unspecified fall, initial encounter: Secondary | ICD-10-CM | POA: Insufficient documentation

## 2011-08-09 DIAGNOSIS — Y9269 Other specified industrial and construction area as the place of occurrence of the external cause: Secondary | ICD-10-CM | POA: Insufficient documentation

## 2011-08-09 DIAGNOSIS — Z6841 Body Mass Index (BMI) 40.0 and over, adult: Secondary | ICD-10-CM | POA: Insufficient documentation

## 2011-08-09 DIAGNOSIS — R1110 Vomiting, unspecified: Secondary | ICD-10-CM | POA: Insufficient documentation

## 2011-08-09 DIAGNOSIS — Z859 Personal history of malignant neoplasm, unspecified: Secondary | ICD-10-CM | POA: Insufficient documentation

## 2011-08-09 DIAGNOSIS — E669 Obesity, unspecified: Secondary | ICD-10-CM | POA: Insufficient documentation

## 2011-08-09 DIAGNOSIS — R112 Nausea with vomiting, unspecified: Secondary | ICD-10-CM | POA: Insufficient documentation

## 2011-08-09 DIAGNOSIS — S0990XA Unspecified injury of head, initial encounter: Secondary | ICD-10-CM | POA: Insufficient documentation

## 2011-08-09 LAB — URINALYSIS, ROUTINE W REFLEX MICROSCOPIC
Bilirubin Urine: NEGATIVE
Glucose, UA: NEGATIVE mg/dL
Ketones, ur: NEGATIVE mg/dL
Nitrite: NEGATIVE
pH: 5.5 (ref 5.0–8.0)

## 2011-08-09 LAB — CBC
HCT: 40 % (ref 36.0–46.0)
Hemoglobin: 12.9 g/dL (ref 12.0–15.0)
MCH: 24.8 pg — ABNORMAL LOW (ref 26.0–34.0)
MCV: 76.8 fL — ABNORMAL LOW (ref 78.0–100.0)
RBC: 5.21 MIL/uL — ABNORMAL HIGH (ref 3.87–5.11)

## 2011-08-09 LAB — URINE MICROSCOPIC-ADD ON

## 2011-08-09 LAB — POCT I-STAT, CHEM 8
BUN: 15 mg/dL (ref 6–23)
Calcium, Ion: 1.18 mmol/L (ref 1.12–1.32)
Creatinine, Ser: 0.8 mg/dL (ref 0.50–1.10)
Glucose, Bld: 117 mg/dL — ABNORMAL HIGH (ref 70–99)
TCO2: 26 mmol/L (ref 0–100)

## 2011-08-09 MED ORDER — PROMETHAZINE HCL 25 MG PO TABS: 25.0000 mg | ORAL_TABLET | Freq: Four times a day (QID) | ORAL | Status: DC | PRN

## 2011-08-09 MED ORDER — ONDANSETRON HCL 4 MG/2ML IJ SOLN
4.0000 mg | Freq: Once | INTRAMUSCULAR | Status: AC
  Administered 2011-08-09: 4 mg via INTRAVENOUS
  Filled 2011-08-09: qty 2

## 2011-08-09 MED ORDER — NAPROXEN 500 MG PO TABS: 500.0000 mg | ORAL_TABLET | Freq: Two times a day (BID) | ORAL | Status: DC

## 2011-08-09 MED ORDER — KETOROLAC TROMETHAMINE 30 MG/ML IJ SOLN
30.0000 mg | Freq: Once | INTRAMUSCULAR | Status: AC
  Administered 2011-08-09: 30 mg via INTRAVENOUS
  Filled 2011-08-09: qty 1

## 2011-08-09 MED ORDER — PROMETHAZINE HCL 25 MG/ML IJ SOLN
25.0000 mg | Freq: Once | INTRAMUSCULAR | Status: AC
  Administered 2011-08-09: 25 mg via INTRAVENOUS
  Filled 2011-08-09: qty 1

## 2011-08-09 MED ORDER — SODIUM CHLORIDE 0.9 % IV BOLUS (SEPSIS)
1000.0000 mL | INTRAVENOUS | Status: AC
  Administered 2011-08-09: 1000 mL via INTRAVENOUS

## 2011-08-09 NOTE — ED Notes (Signed)
Pt was in the process of performing CPR when pt started having SOB and vomited blood. Pt then had syncopal episode striking left side of face.

## 2011-08-09 NOTE — ED Provider Notes (Signed)
History     CSN: [id number]  Arrival date & time 08/09/11  1924   First MD Initiated Contact with Patient 08/09/11 1950      Chief Complaint  Patient presents with  . Shortness of Breath  . Emesis  . Loss of Consciousness  . Head Injury  . Hematemesis    (Consider location/radiation/quality/duration/timing/severity/associated sxs/prior treatment) HPI Comments: Patient is a 30 year old female with a history of polycystic ovarian syndrome, asthma. 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 During this time she started to feel lightheaded and dizzy, according to coworkers she went pale in the face and passed out striking her head, left ribs and left hip on the floor. She had a brief loss of consciousness followed by nausea vomiting and some hematemesis. This has gradually improved, is persistent and is associated with watery diarrhea for the last 2 days. She is having bowel movements every one to 2 hours which are watery and nonbloody. Since striking her head she has had intermittent "blackout spells". She was rushed to the ER by her coworkers in private vehicle  Patient is a 30 y.o. female presenting with shortness of breath, vomiting, syncope, and head injury. The history is provided by the patient.  Shortness of Breath  Associated symptoms include shortness of breath.  Emesis   Loss of Consciousness Associated symptoms include shortness of breath.  Head Injury  Associated symptoms include vomiting.    Past Medical History  Diagnosis Date  . Anemia 2008  . Asthma 2010  . Cancer 2008    unspecified type  . Gestational diabetes 2009, 2011  . DVT (deep venous thrombosis)     location unspecified     Past Surgical History  Procedure Date  . Cesarean section 2009, 2011    indication unknown  . Salpingoophorectomy 2007   indication is ovarian cyst, side unknown     Family History  Problem Relation Age of Onset  . Breast cancer Mother 46  . Breast cancer Maternal Grandmother 45  . Breast cancer Paternal Grandmother 45  . Hypertension Father   . Stroke Father   . Hyperlipidemia Father   . Heart failure Father   . Diabetes Father   . Osteoarthritis Father   . Cancer Mother   . Cancer Maternal Grandmother   . Cancer Paternal Grandmother     History  Substance Use Topics  . Smoking status: Never Smoker   . Smokeless tobacco: Not on file  . Alcohol Use: No    OB History    Grav Para Term Preterm Abortions TAB SAB Ect Mult Living                  Review of Systems  Respiratory: Positive for shortness of breath.   Cardiovascular: Positive for syncope.  Gastrointestinal: Positive for vomiting.  All other systems reviewed and are negative.    Allergies  Tomato  Home Medications   Current Outpatient Rx  Name Route Sig Dispense Refill  . ETONOGESTREL 68 MG [place] IMPL Subcutaneous Inject 1 each into the skin once.      Marland Kitchen NAPROXEN 500 MG PO TABS Oral Take 1 tablet (500 mg total) by mouth 2 (two) times daily with a meal. 30 tablet 0  . PROMETHAZINE HCL 25 MG PO TABS Oral Take 1 tablet (25 mg total) by mouth every 6 (  six) hours as needed for nausea. 12 tablet 0    BP 128/83  Pulse 109  Temp(Src) 97.5 F (36.4 C) (Oral)  Resp 26  Ht 5\' 5"  (1.651 m)  Wt 293 lb (132.904 kg)  BMI 48.76 kg/m2  SpO2 98%  LMP 06/27/2011  Physical Exam  Nursing note and vitals reviewed. Constitutional: She appears well-developed and well-nourished. No distress.  HENT:  Head: Normocephalic.  Mouth/Throat: Oropharynx is clear and moist. No oropharyngeal exudate.       No malocclusion or hemotympanum. Normal extraocular movements, normal dentition without tenderness or pulsation. Tenderness to palpation over the left forehead and left zygoma without bruising  Eyes: Conjunctivae and EOM are normal. Pupils are  equal, round, and reactive to light. Right eye exhibits no discharge. Left eye exhibits no discharge. No scleral icterus.       No extraocular entrapment clinically  Neck: Normal range of motion. Neck supple. No JVD present. No thyromegaly present.  Cardiovascular: Normal rate, regular rhythm, normal heart sounds and intact distal pulses.  Exam reveals no gallop and no friction rub.   No murmur heard. Pulmonary/Chest: Effort normal and breath sounds normal. No respiratory distress. She has no wheezes. She has no rales. She exhibits tenderness ( tenderness over left ribs).       No respiratory distress, normal lung sounds  Abdominal: Soft. Bowel sounds are normal. She exhibits no distension and no mass. There is no tenderness.  Musculoskeletal: Normal range of motion. She exhibits no edema and no tenderness.       Tenderness over the left hip, and with range of motion of the left hip. No other joint or extremity tenderness  Lymphadenopathy:    She has no cervical adenopathy.  Neurological: She is alert. Coordination normal.  Skin: Skin is warm and dry.       Bruise to the left bicep  Psychiatric: She has a normal mood and affect. Her behavior is normal.    ED Course  Procedures (including critical care time)  Labs Reviewed  URINALYSIS, ROUTINE W REFLEX MICROSCOPIC - Abnormal; Notable for the following:    Specific Gravity, Urine >1.030 (*)    Hgb urine dipstick SMALL (*)    Protein, ur 100 (*)    All other components within normal limits  CBC - Abnormal; Notable for the following:    WBC 12.7 (*)    RBC 5.21 (*)    MCV 76.8 (*)    MCH 24.8 (*)    All other components within normal limits  POCT I-STAT, CHEM 8 - Abnormal; Notable for the following:    Glucose, Bld 117 (*)    All other components within normal limits  URINE MICROSCOPIC-ADD ON - Abnormal; Notable for the following:    Squamous Epithelial / LPF MANY (*)    Bacteria, UA FEW (*)    Casts HYALINE CASTS (*)    All other  components within normal limits  PREGNANCY, URINE  I-STAT, CHEM 8   Dg Ribs Unilateral W/chest Left  08/09/2011  *RADIOLOGY REPORT*  Clinical Data: Left-sided rib pain after syncope and fall.  LEFT RIBS AND CHEST - 3+ VIEW  Comparison: 12/20/2010  Findings: The heart size and pulmonary vascularity are normal. The lungs appear clear and expanded without focal air space disease or consolidation. No blunting of the costophrenic angles.  No pneumothorax.  No significant change since previous study.  The left ribs appear intact.  No acute displaced fractures identified.  No focal bone lesions  appreciated.  IMPRESSION: No evidence of active pulmonary disease.  No displaced left rib fractures identified.  Original Report Authenticated By: Marlon Pel, M.D.   Dg Hip Complete Left  08/09/2011  *RADIOLOGY REPORT*  Clinical Data: Left hip pain.  Syncopal episode.  LEFT HIP - COMPLETE 2+ VIEW  Comparison: CT pelvis from 08/12/2008  Findings: No fracture, dislocation, or acute bony findings noted. No conventional radiographic findings of avascular necrosis.  IMPRESSION:  1.  No significant abnormality identified.  Original Report Authenticated By: Dellia Cloud, M.D.   Ct Head Wo Contrast  08/09/2011  *RADIOLOGY REPORT*  Clinical Data: Head trauma secondary to syncope and a fall.  CT HEAD WITHOUT CONTRAST  Technique:  Contiguous axial images were obtained from the base of the skull through the vertex without contrast.  Comparison: 02/15/2010  Findings: There is no acute intracranial hemorrhage, infarction, or mass.  Brain parenchyma is normal.  Osseous structures are normal.  IMPRESSION: Normal exam.  Original Report Authenticated By: Gwynn Burly, M.D.   Ct Maxillofacial Wo Cm  08/09/2011  *RADIOLOGY REPORT*  Clinical Data: Facial trauma after syncope and a fall.  CT MAXILLOFACIAL WITHOUT CONTRAST  Technique:  Multidetector CT imaging of the maxillofacial structures was performed. Multiplanar  CT image reconstructions were also generated.  Comparison: 09/22/2001  Findings: There is no fracture or acute sinus opacification or soft tissue hematoma.  IMPRESSION: Normal exam.  Original Report Authenticated By: Gwynn Burly, M.D.     1. Nausea and vomiting   2. Syncope   3. Vagal reaction   4. Dehydration       MDM  Obese female status post fall with likely vagal event related to both dehydration and stressful event. Will require fluid rehydration, electrolytes, urinalysis to check for pregnancy and hydration level, imaging to rule out rib fracture, hip fracture or brain injury with recurrent syncopal events and ongoing headache with nausea and vomiting.   Labs negative, urinalysis confirms dehydration, patient feels much better after Zofran and IV fluids. We'll discharge him with Phenergan and followup as needed.     Vida Roller, MD 08/09/11 [phone]

## 2012-02-27 ENCOUNTER — Emergency Department (HOSPITAL_COMMUNITY)
Admission: EM | Admit: 2012-02-27 | Discharge: 2012-02-28 | Disposition: A | Discharge status: Home / Self Care | Payer: Medicaid Other | Attending: Emergency Medicine | Admitting: Emergency Medicine

## 2012-02-27 ENCOUNTER — Encounter (HOSPITAL_COMMUNITY): Payer: Self-pay | Admitting: Physical Medicine and Rehabilitation

## 2012-02-27 DIAGNOSIS — E669 Obesity, unspecified: Secondary | ICD-10-CM | POA: Insufficient documentation

## 2012-02-27 DIAGNOSIS — D6859 Other primary thrombophilia: Secondary | ICD-10-CM | POA: Insufficient documentation

## 2012-02-27 DIAGNOSIS — E282 Polycystic ovarian syndrome: Secondary | ICD-10-CM | POA: Insufficient documentation

## 2012-02-27 DIAGNOSIS — M549 Dorsalgia, unspecified: Secondary | ICD-10-CM | POA: Insufficient documentation

## 2012-02-27 DIAGNOSIS — F17200 Nicotine dependence, unspecified, uncomplicated: Secondary | ICD-10-CM | POA: Insufficient documentation

## 2012-02-27 DIAGNOSIS — R51 Headache: Secondary | ICD-10-CM | POA: Insufficient documentation

## 2012-02-27 DIAGNOSIS — R0789 Other chest pain: Secondary | ICD-10-CM | POA: Insufficient documentation

## 2012-02-27 MED ORDER — MORPHINE SULFATE 4 MG/ML IJ SOLN
INTRAMUSCULAR | Status: AC
  Filled 2012-02-27: qty 1

## 2012-02-27 MED ORDER — MORPHINE SULFATE 2 MG/ML IJ SOLN
INTRAMUSCULAR | Status: AC
  Filled 2012-02-27: qty 1

## 2012-02-27 MED ORDER — ONDANSETRON HCL 4 MG/2ML IJ SOLN
INTRAMUSCULAR | Status: AC
  Filled 2012-02-27: qty 4

## 2012-02-27 MED ORDER — ONDANSETRON HCL 4 MG/2ML IJ SOLN
INTRAMUSCULAR | Status: AC
  Filled 2012-02-27: qty 2

## 2012-02-27 NOTE — ED Notes (Signed)
Pt presents to department for evaluation of swelling and numbness to bilateral hands and feet. Also states back pain and headache. Onset of symptoms this morning. Strong equal bilateral grip strengths. Neurological deficits noted. Able to move all extremities. Pt ambulatory to triage. 8/10 pain at the time. She is conscious alert and oriented x4.

## 2012-02-28 ENCOUNTER — Other Ambulatory Visit (HOSPITAL_COMMUNITY): Payer: Self-pay | Admitting: Emergency Medicine

## 2012-02-28 ENCOUNTER — Encounter (HOSPITAL_COMMUNITY): Payer: Self-pay

## 2012-02-28 ENCOUNTER — Ambulatory Visit (HOSPITAL_COMMUNITY)
Admission: RE | Admit: 2012-02-28 | Discharge: 2012-02-28 | Disposition: A | Discharge status: Home / Self Care | Payer: Medicaid Other | Source: Ambulatory Visit | Attending: Emergency Medicine | Admitting: Emergency Medicine

## 2012-02-28 DIAGNOSIS — R079 Chest pain, unspecified: Secondary | ICD-10-CM | POA: Insufficient documentation

## 2012-02-28 DIAGNOSIS — Z86718 Personal history of other venous thrombosis and embolism: Secondary | ICD-10-CM | POA: Insufficient documentation

## 2012-02-28 LAB — DIFFERENTIAL
Eosinophils Absolute: 0.1 10*3/uL (ref 0.0–0.7)
Eosinophils Relative: 1 % (ref 0–5)
Lymphocytes Relative: 28 % (ref 12–46)
Lymphs Abs: 2.6 10*3/uL (ref 0.7–4.0)
Monocytes Relative: 6 % (ref 3–12)
Neutrophils Relative %: 64 % (ref 43–77)

## 2012-02-28 LAB — BASIC METABOLIC PANEL
CO2: 23 mEq/L (ref 19–32)
Glucose, Bld: 118 mg/dL — ABNORMAL HIGH (ref 70–99)
Potassium: 4.2 mEq/L (ref 3.5–5.1)
Sodium: 138 mEq/L (ref 135–145)

## 2012-02-28 LAB — CBC
Hemoglobin: 12.3 g/dL (ref 12.0–15.0)
MCH: 25.1 pg — ABNORMAL LOW (ref 26.0–34.0)
MCV: 76.8 fL — ABNORMAL LOW (ref 78.0–100.0)
RBC: 4.91 MIL/uL (ref 3.87–5.11)
WBC: 9.1 10*3/uL (ref 4.0–10.5)

## 2012-02-28 LAB — POCT PREGNANCY, URINE: Preg Test, Ur: NEGATIVE

## 2012-02-28 MED ORDER — IOHEXOL 350 MG/ML SOLN
100.0000 mL | Freq: Once | INTRAVENOUS | Status: AC | PRN
  Administered 2012-02-28: 100 mL via INTRAVENOUS

## 2012-02-28 NOTE — ED Notes (Signed)
See downtime charting. 

## 2012-09-02 ENCOUNTER — Emergency Department (HOSPITAL_COMMUNITY)
Admission: EM | Admit: 2012-09-02 | Discharge: 2012-09-02 | Disposition: A | Discharge status: Home / Self Care | Payer: Medicaid Other | Attending: Emergency Medicine | Admitting: Emergency Medicine

## 2012-09-02 ENCOUNTER — Encounter (HOSPITAL_COMMUNITY): Payer: Self-pay

## 2012-09-02 ENCOUNTER — Emergency Department (HOSPITAL_COMMUNITY): Payer: Medicaid Other

## 2012-09-02 DIAGNOSIS — N938 Other specified abnormal uterine and vaginal bleeding: Secondary | ICD-10-CM | POA: Insufficient documentation

## 2012-09-02 DIAGNOSIS — Z6841 Body Mass Index (BMI) 40.0 and over, adult: Secondary | ICD-10-CM | POA: Insufficient documentation

## 2012-09-02 DIAGNOSIS — F17200 Nicotine dependence, unspecified, uncomplicated: Secondary | ICD-10-CM | POA: Insufficient documentation

## 2012-09-02 DIAGNOSIS — N939 Abnormal uterine and vaginal bleeding, unspecified: Secondary | ICD-10-CM

## 2012-09-02 DIAGNOSIS — N949 Unspecified condition associated with female genital organs and menstrual cycle: Secondary | ICD-10-CM

## 2012-09-02 DIAGNOSIS — Z3202 Encounter for pregnancy test, result negative: Secondary | ICD-10-CM | POA: Insufficient documentation

## 2012-09-02 DIAGNOSIS — R109 Unspecified abdominal pain: Secondary | ICD-10-CM | POA: Insufficient documentation

## 2012-09-02 DIAGNOSIS — Z8709 Personal history of other diseases of the respiratory system: Secondary | ICD-10-CM | POA: Insufficient documentation

## 2012-09-02 DIAGNOSIS — Z86718 Personal history of other venous thrombosis and embolism: Secondary | ICD-10-CM | POA: Insufficient documentation

## 2012-09-02 DIAGNOSIS — Z862 Personal history of diseases of the blood and blood-forming organs and certain disorders involving the immune mechanism: Secondary | ICD-10-CM | POA: Insufficient documentation

## 2012-09-02 DIAGNOSIS — Z8589 Personal history of malignant neoplasm of other organs and systems: Secondary | ICD-10-CM | POA: Insufficient documentation

## 2012-09-02 DIAGNOSIS — E669 Obesity, unspecified: Secondary | ICD-10-CM | POA: Insufficient documentation

## 2012-09-02 DIAGNOSIS — Z8632 Personal history of gestational diabetes: Secondary | ICD-10-CM | POA: Insufficient documentation

## 2012-09-02 DIAGNOSIS — N83209 Unspecified ovarian cyst, unspecified side: Secondary | ICD-10-CM | POA: Insufficient documentation

## 2012-09-02 DIAGNOSIS — Z79899 Other long term (current) drug therapy: Secondary | ICD-10-CM | POA: Insufficient documentation

## 2012-09-02 LAB — URINALYSIS, ROUTINE W REFLEX MICROSCOPIC
Glucose, UA: NEGATIVE mg/dL
Leukocytes, UA: NEGATIVE
Nitrite: NEGATIVE
Specific Gravity, Urine: >1.03 — ABNORMAL HIGH (ref 1.005–1.030)
pH: 5.5 (ref 5.0–8.0)

## 2012-09-02 LAB — URINE MICROSCOPIC-ADD ON

## 2012-09-02 LAB — PREGNANCY, URINE: Preg Test, Ur: NEGATIVE

## 2012-09-02 MED ORDER — OXYCODONE-ACETAMINOPHEN 5-325 MG PO TABS
1.0000 | ORAL_TABLET | Freq: Once | ORAL | Status: AC
  Administered 2012-09-02: 1 via ORAL
  Filled 2012-09-02 (×2): qty 1

## 2012-09-02 MED ORDER — OXYCODONE-ACETAMINOPHEN 5-325 MG PO TABS: 1.0000 | ORAL_TABLET | Freq: Three times a day (TID) | ORAL | Status: DC | PRN

## 2012-09-02 MED ORDER — ONDANSETRON HCL 4 MG PO TABS: 4.0000 mg | ORAL_TABLET | Freq: Four times a day (QID) | ORAL | Status: DC

## 2012-09-02 NOTE — ED Notes (Signed)
Pt reports this is her 4th pregnancy, has 2 living children.  Had one miscarriage between 1st and 2nd child.

## 2012-09-02 NOTE — ED Provider Notes (Signed)
History  This chart was scribed for Mia Munch, MD by [name], ED Scribe. This patient was seen in room APA03/APA03 and the patient's care was started at 1836.  CSN: [id number]  Arrival date & time 09/02/12  1831   First MD Initiated Contact with Patient 09/02/12 1836      No chief complaint on file.    The history is provided by the patient. No language interpreter was used.    [name] is a 32 y.o. female brought in by ambulance, who presents to the Emergency Department complaining of vaginal bleeding with associated lower abdominal pain. She states the bleeding began 30 minutes ago when she was at work. She describes the pain as "tight." She states her LMP was in early November. She states she has had multiple positive pregnancy tests at home and one positive result from the health department. She states she has had 1 miscarriage in the past.    Past Medical History  Diagnosis Date  . Anemia 2008  . Asthma 2010  . Cancer 2008    unspecified type  . Gestational diabetes 2009, 2011  . DVT (deep venous thrombosis)     location unspecified     Past Surgical History  Procedure Date  . Cesarean section 2009, 2011    indication unknown  . Salpingoophorectomy 2007    indication is ovarian cyst, side unknown   . Dilation and curettage of uterus     Family History  Problem Relation Age of Onset  . Breast cancer Mother 44  . Breast cancer Maternal Grandmother 45  . Breast cancer Paternal Grandmother 71  . Hypertension Father   . Stroke Father   . Hyperlipidemia Father   . Heart failure Father   . Diabetes Father   . Osteoarthritis Father   . Cancer Mother   . Cancer Maternal Grandmother   . Cancer Paternal Grandmother     History  Substance Use Topics  . Smoking status: Current Some Day Smoker    Types: Cigarettes  . Smokeless tobacco: Not on file  . Alcohol Use: No   No Ob history available.   Review of Systems  Constitutional:       Per  HPI, otherwise negative  HENT:       Per HPI, otherwise negative  Eyes: Negative.   Respiratory:       Per HPI, otherwise negative  Cardiovascular:       Per HPI, otherwise negative  Gastrointestinal: Negative for vomiting.  Genitourinary: Negative.   Musculoskeletal:       Per HPI, otherwise negative  Skin: Negative.   Neurological: Negative for syncope.    Allergies  Tomato  Home Medications   Current Outpatient Rx  Name  Route  Sig  Dispense  Refill  . ACETAMINOPHEN ER 650 MG PO TBCR   Oral   Take 650 mg by mouth every 4 (four) hours as needed. For pain         . BC HEADACHE POWDER PO   Oral   Take 1 packet by mouth every 8 (eight) hours as needed. For pain         . ETONOGESTREL 68 MG [place] IMPL   Subcutaneous   Inject 1 each into the skin once. Implanted approx. September 2011           Triage Vitals: BP 120/78  Pulse 85  Temp 98 F (36.7 C) (Oral)  Resp 20  Ht 5\' 5"  (1.651 m)  Wt 320 lb (145.151 kg)  BMI 53.25 kg/m2  SpO2 100%  LMP 06/21/2012  Physical Exam  Nursing note and vitals reviewed. Constitutional: She is oriented to person, place, and time. She appears well-developed and well-nourished. No distress.       Obese  HENT:  Head: Normocephalic and atraumatic.  Eyes: Conjunctivae normal and EOM are normal.  Neck: Normal range of motion.  Cardiovascular: Normal rate, regular rhythm and normal heart sounds.  Exam reveals no gallop and no friction rub.   No murmur heard. Pulmonary/Chest: Effort normal and breath sounds normal. No stridor. No respiratory distress. She has no wheezes. She has no rales. She exhibits no tenderness.  Abdominal: Soft. Bowel sounds are normal. She exhibits no distension and no mass. There is no tenderness. There is no rebound and no guarding.  Musculoskeletal: She exhibits no edema.  Neurological: She is alert and oriented to person, place, and time. No cranial nerve deficit.  Skin: Skin is warm and dry.  Psychiatric:  She has a normal mood and affect.    ED Course  Procedures (including critical care time)  DIAGNOSTIC STUDIES: Oxygen Saturation is 100% on room air, normal by my interpretation.    COORDINATION OF CARE:  6:58 PM: Discussed treatment plan which includes blood and urine pregnancy tests and a UA with pt at bedside and pt agreed to plan.     Labs Reviewed - No data to display No results found.   No diagnosis found.  8:49 PM I reviewed the results thus far with the patient, including the ultrasound with no evidence of urgency, both demonstration of right-sided adnexal cyst.  The patient's urinalysis also suggests that she is not actively pregnant. The patient is sitting upright, seemingly in no distress MDM  I personally performed the services described in this documentation, which was scribed in my presence. The recorded information has been reviewed and is accurate.  This young female presents with concern of new vaginal leading, with reports of positive home pregnancy test.  Given this new bleeding, her abdominal pain, there is concern for ectopic versus miscarriage.  With ultrasound available only immediately after the patient's initial evaluation, she had this done expeditiously, otherwise not option would be lost.  Ultrasound, and labs are not consistent with pregnancy.  It does appear as though the patient has a right-sided adnexal cyst, which likely extensor pain, vaginal bleeding.  The patient remained in no distress throughout her ED stay.  She has an obstetrician with and she will followup in 2 days.    Mia Munch, MD 09/02/12 2222

## 2012-09-02 NOTE — ED Notes (Signed)
Dr. Jeraldine Loots completed in room ED ultrasound. States he is going to do a pelvic exam and transport patient to ultrasound. Pt resting comfortably at this time

## 2012-09-02 NOTE — ED Notes (Signed)
Pt back from [place]. Pt comfortable and awaiting EDP for pelvic exam. Pelvic cart set up at bedside

## 2012-09-02 NOTE — ED Notes (Signed)
Pt arrived via EMS.   Reports a few minutes ago was at work and started having vaginal bleeding.  Reports LMP was in early Nov. Pt reports has had positive pregnancy tests at home and at the health dept.  Has appt with Dr. Emelda Fear Monday.  Pt c/o abd feeling "tight."

## 2012-09-02 NOTE — ED Notes (Signed)
Dr.Lockwood at bedside  

## 2012-12-26 ENCOUNTER — Emergency Department (HOSPITAL_COMMUNITY): Payer: Self-pay

## 2012-12-26 ENCOUNTER — Encounter (HOSPITAL_COMMUNITY): Payer: Self-pay | Admitting: *Deleted

## 2012-12-26 ENCOUNTER — Emergency Department (HOSPITAL_COMMUNITY)
Admission: EM | Admit: 2012-12-26 | Discharge: 2012-12-26 | Disposition: A | Discharge status: Home / Self Care | Payer: Self-pay | Attending: Emergency Medicine | Admitting: Emergency Medicine

## 2012-12-26 DIAGNOSIS — R42 Dizziness and giddiness: Secondary | ICD-10-CM | POA: Insufficient documentation

## 2012-12-26 DIAGNOSIS — Z9889 Other specified postprocedural states: Secondary | ICD-10-CM | POA: Insufficient documentation

## 2012-12-26 DIAGNOSIS — Z862 Personal history of diseases of the blood and blood-forming organs and certain disorders involving the immune mechanism: Secondary | ICD-10-CM | POA: Insufficient documentation

## 2012-12-26 DIAGNOSIS — Z859 Personal history of malignant neoplasm, unspecified: Secondary | ICD-10-CM | POA: Insufficient documentation

## 2012-12-26 DIAGNOSIS — Y9389 Activity, other specified: Secondary | ICD-10-CM | POA: Insufficient documentation

## 2012-12-26 DIAGNOSIS — R112 Nausea with vomiting, unspecified: Secondary | ICD-10-CM | POA: Insufficient documentation

## 2012-12-26 DIAGNOSIS — L02419 Cutaneous abscess of limb, unspecified: Secondary | ICD-10-CM | POA: Insufficient documentation

## 2012-12-26 DIAGNOSIS — Y929 Unspecified place or not applicable: Secondary | ICD-10-CM | POA: Insufficient documentation

## 2012-12-26 DIAGNOSIS — S90569A Insect bite (nonvenomous), unspecified ankle, initial encounter: Secondary | ICD-10-CM | POA: Insufficient documentation

## 2012-12-26 DIAGNOSIS — J45909 Unspecified asthma, uncomplicated: Secondary | ICD-10-CM | POA: Insufficient documentation

## 2012-12-26 DIAGNOSIS — Z8632 Personal history of gestational diabetes: Secondary | ICD-10-CM | POA: Insufficient documentation

## 2012-12-26 DIAGNOSIS — Z86718 Personal history of other venous thrombosis and embolism: Secondary | ICD-10-CM | POA: Insufficient documentation

## 2012-12-26 DIAGNOSIS — F17200 Nicotine dependence, unspecified, uncomplicated: Secondary | ICD-10-CM | POA: Insufficient documentation

## 2012-12-26 DIAGNOSIS — R5381 Other malaise: Secondary | ICD-10-CM | POA: Insufficient documentation

## 2012-12-26 DIAGNOSIS — Z3202 Encounter for pregnancy test, result negative: Secondary | ICD-10-CM | POA: Insufficient documentation

## 2012-12-26 DIAGNOSIS — R197 Diarrhea, unspecified: Secondary | ICD-10-CM | POA: Insufficient documentation

## 2012-12-26 DIAGNOSIS — L03116 Cellulitis of left lower limb: Secondary | ICD-10-CM

## 2012-12-26 LAB — CBC WITH DIFFERENTIAL/PLATELET
Basophils Relative: 0 % (ref 0–1)
Eosinophils Relative: 1 % (ref 0–5)
HCT: 37 % (ref 36.0–46.0)
Hemoglobin: 11.9 g/dL — ABNORMAL LOW (ref 12.0–15.0)
MCH: 24.8 pg — ABNORMAL LOW (ref 26.0–34.0)
MCHC: 32.2 g/dL (ref 30.0–36.0)
MCV: 77.1 fL — ABNORMAL LOW (ref 78.0–100.0)
Monocytes Absolute: 0.6 10*3/uL (ref 0.1–1.0)
Monocytes Relative: 6 % (ref 3–12)
Neutro Abs: 7.4 10*3/uL (ref 1.7–7.7)

## 2012-12-26 LAB — URINALYSIS, ROUTINE W REFLEX MICROSCOPIC
Bilirubin Urine: NEGATIVE
Glucose, UA: >1000 mg/dL — AB
Hgb urine dipstick: NEGATIVE
Protein, ur: NEGATIVE mg/dL

## 2012-12-26 LAB — COMPREHENSIVE METABOLIC PANEL
Albumin: 3.5 g/dL (ref 3.5–5.2)
BUN: 14 mg/dL (ref 6–23)
Chloride: 102 mEq/L (ref 96–112)
Creatinine, Ser: 0.75 mg/dL (ref 0.50–1.10)
GFR calc non Af Amer: >90 mL/min (ref >90)
Total Bilirubin: 0.2 mg/dL — ABNORMAL LOW (ref 0.3–1.2)

## 2012-12-26 LAB — LIPASE, BLOOD: Lipase: 25 U/L (ref 11–59)

## 2012-12-26 LAB — PREGNANCY, URINE: Preg Test, Ur: NEGATIVE

## 2012-12-26 LAB — URINE MICROSCOPIC-ADD ON

## 2012-12-26 MED ORDER — SULFAMETHOXAZOLE-TRIMETHOPRIM 800-160 MG PO TABS: 1.0000 | ORAL_TABLET | Freq: Two times a day (BID) | ORAL | Status: DC

## 2012-12-26 MED ORDER — SODIUM CHLORIDE 0.9 % IV BOLUS (SEPSIS)
1000.0000 mL | Freq: Once | INTRAVENOUS | Status: AC
  Administered 2012-12-26: 1000 mL via INTRAVENOUS

## 2012-12-26 MED ORDER — FAMOTIDINE IN NACL 20-0.9 MG/50ML-% IV SOLN
20.0000 mg | Freq: Once | INTRAVENOUS | Status: AC
  Administered 2012-12-26: 20 mg via INTRAVENOUS
  Filled 2012-12-26: qty 50

## 2012-12-26 MED ORDER — SODIUM CHLORIDE 0.9 % IV SOLN: INTRAVENOUS | Status: DC

## 2012-12-26 MED ORDER — CEPHALEXIN 500 MG PO CAPS: 500.0000 mg | ORAL_CAPSULE | Freq: Four times a day (QID) | ORAL | Status: DC

## 2012-12-26 MED ORDER — ONDANSETRON HCL 4 MG PO TABS: 4.0000 mg | ORAL_TABLET | Freq: Three times a day (TID) | ORAL | Status: DC | PRN

## 2012-12-26 MED ORDER — ONDANSETRON HCL 4 MG/2ML IJ SOLN
4.0000 mg | INTRAMUSCULAR | Status: DC | PRN
  Administered 2012-12-26: 4 mg via INTRAVENOUS
  Filled 2012-12-26: qty 2

## 2012-12-26 NOTE — ED Provider Notes (Signed)
History     CSN: [id number]  Arrival date & time 12/26/12  1552   First MD Initiated Contact with Patient 12/26/12 1615      Chief Complaint  Patient presents with  . Emesis     HPI Pt was seen at 1635.  Per pt, c/o gradual onset and persistence of multiple intermittent episodes of N/V/D that began yesterday.   Describes the stools as "watery." Has been associated with feeling "lightheaded" and generally weak/fatigued.  Denies abd pain, no CP/SOB, no back pain, no fevers, no black or blood in stools or emesis.  Pt also c/o gradual onset and persistence of constant "bug bite" to her left lower leg 2 days ago. Pt states "it's getting red" and she is concerned regarding possible infection.  Denies drainage, no leg injury, no focal motor weakness, no tingling/numbness in extremities, no other areas of rash.     Past Medical History  Diagnosis Date  . Anemia 2008  . Asthma 2010  . Gestational diabetes 2009, 2011  . DVT (deep venous thrombosis)     location unspecified   . Cancer 2008    unspecified type    Past Surgical History  Procedure Laterality Date  . Cesarean section  2009, 2011    indication unknown  . Salpingoophorectomy  2007    indication is ovarian cyst, side unknown   . Dilation and curettage of uterus      Family History  Problem Relation Age of Onset  . Breast cancer Mother 80  . Breast cancer Maternal Grandmother 45  . Breast cancer Paternal Grandmother 41  . Hypertension Father   . Stroke Father   . Hyperlipidemia Father   . Heart failure Father   . Diabetes Father   . Osteoarthritis Father   . Cancer Mother   . Cancer Maternal Grandmother   . Cancer Paternal Grandmother     History  Substance Use Topics  . Smoking status: Current Some Day Smoker    Types: Cigarettes  . Smokeless tobacco: Not on file  . Alcohol Use: Yes     Review of Systems ROS: Statement: All systems negative except as marked or noted in the HPI; Constitutional: Negative  for fever and chills. +generalized weakness/fatigue.; ; Eyes: Negative for eye pain, redness and discharge. ; ; ENMT: Negative for ear pain, hoarseness, nasal congestion, sinus pressure and sore throat. ; ; Cardiovascular: Negative for chest pain, palpitations, diaphoresis, dyspnea and peripheral edema. ; ; Respiratory: Negative for cough, wheezing and stridor. ; ; Gastrointestinal: +N/V/D. Negative for abdominal pain, blood in stool, hematemesis, jaundice and rectal bleeding. . ; ; Genitourinary: Negative for dysuria, flank pain and hematuria. ; ; Musculoskeletal: Negative for back pain and neck pain. Negative for swelling and trauma.; ; Skin: Negative for pruritus, abrasions, blisters, bruising and +skin lesion.; ; Neuro: +lightheadedness. Negative for headache and neck stiffness. Negative for altered level of consciousness , altered mental status, extremity weakness, paresthesias, involuntary movement, seizure and syncope.       Allergies  Tomato  Home Medications   Current Outpatient Rx  Name  Route  Sig  Dispense  Refill  . FLUoxetine (PROZAC) 40 MG capsule   Oral   Take 40 mg by mouth 2 (two) times daily.         . Multiple Vitamin (MULTIVITAMIN WITH MINERALS) TABS   Oral   Take 1 tablet by mouth daily.         Marland Kitchen etonogestrel (IMPLANON) 68 MG IMPL implant  Subcutaneous   Inject 1 each into the skin once. Implanted approx. September 2011           BP 151/88  Pulse 97  Temp(Src) 97.7 F (36.5 C) (Oral)  Resp 21  Ht 5\' 5"  (1.651 m)  Wt 314 lb 6 oz (142.6 kg)  BMI 52.31 kg/m2  SpO2 100%  LMP 12/05/2012  Physical Exam 1640: Physical examination:  Nursing notes reviewed; Vital signs and O2 SAT reviewed;  Constitutional: Well developed, Well nourished, Well hydrated, In no acute distress; Head:  Normocephalic, atraumatic; Eyes: EOMI, PERRL, No scleral icterus; ENMT: Mouth and pharynx normal, Mucous membranes moist; Neck: Supple, Full range of motion, No lymphadenopathy;  Cardiovascular: Regular rate and rhythm, No murmur, rub, or gallop; Respiratory: Breath sounds clear & equal bilaterally, No rales, rhonchi, wheezes.  Speaking full sentences with ease, Normal respiratory effort/excursion; Chest: Nontender, Movement normal; Abdomen: Soft, Nontender, Nondistended, Normal bowel sounds; Genitourinary: No CVA tenderness; Extremities: Pulses normal, +approx <1cm area of scab with surrounding erythema left lateral lower leg without open wound, drainage, fluctuance, or ecchymosis. NT to palp. No soft tissue crepitus. Muscle compartments soft. No deformity. No edema, No calf edema or asymmetry.; Neuro: AA&Ox3, Major CN grossly intact.  Speech clear. No gross focal motor or sensory deficits in extremities.; Skin: Color normal, Warm, Dry.   ED Course  Procedures      MDM  MDM Reviewed: previous chart, nursing note and vitals Interpretation: labs and x-ray   Results for orders placed during the hospital encounter of 12/26/12  URINALYSIS, ROUTINE W REFLEX MICROSCOPIC      Result Value Range   Color, Urine YELLOW  YELLOW   APPearance HAZY (*) CLEAR   Specific Gravity, Urine 1.025  1.005 - 1.030   pH 6.0  5.0 - 8.0   Glucose, UA >1000 (*) NEGATIVE mg/dL   Hgb urine dipstick NEGATIVE  NEGATIVE   Bilirubin Urine NEGATIVE  NEGATIVE   Ketones, ur TRACE (*) NEGATIVE mg/dL   Protein, ur NEGATIVE  NEGATIVE mg/dL   Urobilinogen, UA 0.2  0.0 - 1.0 mg/dL   Nitrite NEGATIVE  NEGATIVE   Leukocytes, UA NEGATIVE  NEGATIVE  PREGNANCY, URINE      Result Value Range   Preg Test, Ur NEGATIVE  NEGATIVE  CBC WITH DIFFERENTIAL      Result Value Range   WBC 10.5  4.0 - 10.5 K/uL   RBC 4.80  3.87 - 5.11 MIL/uL   Hemoglobin 11.9 (*) 12.0 - 15.0 g/dL   HCT 65.7  84.6 - 96.2 %   MCV 77.1 (*) 78.0 - 100.0 fL   MCH 24.8 (*) 26.0 - 34.0 pg   MCHC 32.2  30.0 - 36.0 g/dL   RDW 95.2  84.1 - 32.4 %   Platelets 248  150 - 400 K/uL   Neutrophils Relative % 70  43 - 77 %   Neutro Abs 7.4   1.7 - 7.7 K/uL   Lymphocytes Relative 23  12 - 46 %   Lymphs Abs 2.4  0.7 - 4.0 K/uL   Monocytes Relative 6  3 - 12 %   Monocytes Absolute 0.6  0.1 - 1.0 K/uL   Eosinophils Relative 1  0 - 5 %   Eosinophils Absolute 0.1  0.0 - 0.7 K/uL   Basophils Relative 0  0 - 1 %   Basophils Absolute 0.0  0.0 - 0.1 K/uL  COMPREHENSIVE METABOLIC PANEL      Result Value Range   Sodium  136  135 - 145 mEq/L   Potassium 4.0  3.5 - 5.1 mEq/L   Chloride 102  96 - 112 mEq/L   CO2 24  19 - 32 mEq/L   Glucose, Bld 250 (*) 70 - 99 mg/dL   BUN 14  6 - 23 mg/dL   Creatinine, Ser 1.61  0.50 - 1.10 mg/dL   Calcium 8.8  8.4 - 09.6 mg/dL   Total Protein 7.0  6.0 - 8.3 g/dL   Albumin 3.5  3.5 - 5.2 g/dL   AST 14  0 - 37 U/L   ALT 17  0 - 35 U/L   Alkaline Phosphatase 53  39 - 117 U/L   Total Bilirubin 0.2 (*) 0.3 - 1.2 mg/dL   GFR calc non Af Amer >90  >90 mL/min   GFR calc Af Amer >90  >90 mL/min  LIPASE, BLOOD      Result Value Range   Lipase 25  11 - 59 U/L  URINE MICROSCOPIC-ADD ON      Result Value Range   Squamous Epithelial / LPF FEW (*) RARE   WBC, UA 0-2  <3 WBC/hpf   Bacteria, UA RARE  RARE   Dg Abd Acute W/chest 12/26/2012   *RADIOLOGY REPORT*  Clinical Data: Vomiting, lightheaded, chills, fatigue  ACUTE ABDOMEN SERIES (ABDOMEN 2 VIEW & CHEST 1 VIEW)  Comparison: CT chest dated 02/27/2012  Findings: Lungs are essentially clear.  No focal consolidation.  No pleural effusion or pneumothorax.  The heart is top normal in size.  Nonobstructive bowel gas pattern.  No evidence of free air under the diaphragm on the upright view.  Visualized osseous structures are within normal limits.  IMPRESSION: No evidence of acute cardiopulmonary disease.  No evidence of small bowel obstruction or free air.   Original Report Authenticated By: Charline Bills, M.D.    1930:  Pt has tol PO well while in the ED without N/V.  No stooling while in the ED.  Abd remains benign, VSS. Wants to go home now.  Will tx  localized area of cellulitis LLE with abx. Dx and testing d/w pt and family.  Questions answered.  Verb understanding, agreeable to d/c home with outpt f/u.           Laray Anger, DO 12/28/12 1929

## 2012-12-26 NOTE — ED Notes (Signed)
Vomiting , feels weak,  Bitten by a spider on her lt lower leg 2 days ago

## 2012-12-26 NOTE — ED Notes (Signed)
Pt continues to c/o mild nausea, but reports improvement.  Tolerating p.o liquids at this time.  Expressing desire to eat solid food.  No additional needs verbalized at present.

## 2013-01-12 ENCOUNTER — Emergency Department (HOSPITAL_COMMUNITY)
Admission: EM | Admit: 2013-01-12 | Discharge: 2013-01-12 | Disposition: A | Discharge status: Home / Self Care | Payer: Self-pay | Attending: Emergency Medicine | Admitting: Emergency Medicine

## 2013-01-12 ENCOUNTER — Encounter (HOSPITAL_COMMUNITY): Payer: Self-pay | Admitting: Emergency Medicine

## 2013-01-12 DIAGNOSIS — Z79899 Other long term (current) drug therapy: Secondary | ICD-10-CM | POA: Insufficient documentation

## 2013-01-12 DIAGNOSIS — J45909 Unspecified asthma, uncomplicated: Secondary | ICD-10-CM | POA: Insufficient documentation

## 2013-01-12 DIAGNOSIS — Z86718 Personal history of other venous thrombosis and embolism: Secondary | ICD-10-CM | POA: Insufficient documentation

## 2013-01-12 DIAGNOSIS — F17200 Nicotine dependence, unspecified, uncomplicated: Secondary | ICD-10-CM | POA: Insufficient documentation

## 2013-01-12 DIAGNOSIS — Z859 Personal history of malignant neoplasm, unspecified: Secondary | ICD-10-CM | POA: Insufficient documentation

## 2013-01-12 DIAGNOSIS — R197 Diarrhea, unspecified: Secondary | ICD-10-CM | POA: Insufficient documentation

## 2013-01-12 DIAGNOSIS — K5289 Other specified noninfective gastroenteritis and colitis: Secondary | ICD-10-CM | POA: Insufficient documentation

## 2013-01-12 DIAGNOSIS — R6883 Chills (without fever): Secondary | ICD-10-CM | POA: Insufficient documentation

## 2013-01-12 DIAGNOSIS — Z8632 Personal history of gestational diabetes: Secondary | ICD-10-CM | POA: Insufficient documentation

## 2013-01-12 DIAGNOSIS — K529 Noninfective gastroenteritis and colitis, unspecified: Secondary | ICD-10-CM

## 2013-01-12 DIAGNOSIS — K921 Melena: Secondary | ICD-10-CM | POA: Insufficient documentation

## 2013-01-12 DIAGNOSIS — Z3202 Encounter for pregnancy test, result negative: Secondary | ICD-10-CM | POA: Insufficient documentation

## 2013-01-12 DIAGNOSIS — R319 Hematuria, unspecified: Secondary | ICD-10-CM | POA: Insufficient documentation

## 2013-01-12 DIAGNOSIS — Z862 Personal history of diseases of the blood and blood-forming organs and certain disorders involving the immune mechanism: Secondary | ICD-10-CM | POA: Insufficient documentation

## 2013-01-12 LAB — COMPREHENSIVE METABOLIC PANEL
Alkaline Phosphatase: 57 U/L (ref 39–117)
BUN: 9 mg/dL (ref 6–23)
Calcium: 9.1 mg/dL (ref 8.4–10.5)
GFR calc Af Amer: >90 mL/min (ref >90)
Glucose, Bld: 172 mg/dL — ABNORMAL HIGH (ref 70–99)
Total Protein: 7.1 g/dL (ref 6.0–8.3)

## 2013-01-12 LAB — URINALYSIS, ROUTINE W REFLEX MICROSCOPIC
Glucose, UA: 100 mg/dL — AB
Hgb urine dipstick: NEGATIVE
Ketones, ur: NEGATIVE mg/dL
Protein, ur: NEGATIVE mg/dL

## 2013-01-12 LAB — CBC
HCT: 37.2 % (ref 36.0–46.0)
Hemoglobin: 12.1 g/dL (ref 12.0–15.0)
MCH: 25.2 pg — ABNORMAL LOW (ref 26.0–34.0)
MCHC: 32.5 g/dL (ref 30.0–36.0)

## 2013-01-12 MED ORDER — ACETAMINOPHEN 500 MG PO TABS
ORAL_TABLET | ORAL | Status: AC
  Administered 2013-01-12: 1000 mg
  Filled 2013-01-12: qty 2

## 2013-01-12 MED ORDER — SODIUM CHLORIDE 0.9 % IV BOLUS (SEPSIS)
1000.0000 mL | Freq: Once | INTRAVENOUS | Status: AC
  Administered 2013-01-12: 1000 mL via INTRAVENOUS

## 2013-01-12 MED ORDER — KETOROLAC TROMETHAMINE 30 MG/ML IJ SOLN
30.0000 mg | Freq: Once | INTRAMUSCULAR | Status: AC
  Administered 2013-01-12: 30 mg via INTRAVENOUS
  Filled 2013-01-12: qty 1

## 2013-01-12 MED ORDER — DICYCLOMINE HCL 20 MG PO TABS: ORAL_TABLET | ORAL | Status: DC

## 2013-01-12 MED ORDER — ONDANSETRON HCL 4 MG/2ML IJ SOLN
4.0000 mg | Freq: Once | INTRAMUSCULAR | Status: AC
  Administered 2013-01-12: 4 mg via INTRAVENOUS
  Filled 2013-01-12: qty 2

## 2013-01-12 MED ORDER — PROMETHAZINE HCL 25 MG PO TABS: 25.0000 mg | ORAL_TABLET | Freq: Four times a day (QID) | ORAL | Status: DC | PRN

## 2013-01-12 NOTE — ED Provider Notes (Signed)
History    This chart was scribed for Mia Lennert, MD by Toya Smothers, ED Scribe. The patient was seen in room APA05/APA05. Patient's care was started at 1400.  CSN: [id number]  Arrival date & time 01/12/13  1400   First MD Initiated Contact with Patient 01/12/13 1725      Chief Complaint  Patient presents with  . Diarrhea  . Emesis  . Nausea   Patient is a 32 y.o. female presenting with diarrhea and vomiting. The history is provided by the patient. No language interpreter was used.  Diarrhea Quality:  Watery (spotted with blood) Severity:  Moderate Onset quality:  Sudden Duration:  12 hours Timing:  Constant Progression:  Unchanged Relieved by:  Nothing Worsened by:  Nothing tried Associated symptoms: chills and vomiting   Associated symptoms: no abdominal pain, no recent cough and no headaches   Vomiting:    Quality:  Stomach contents   Number of occurrences:  More than 10 episodes   Severity:  Moderate   Timing:  Intermittent   Progression:  Unchanged Emesis Associated symptoms: chills and diarrhea   Associated symptoms: no abdominal pain, no cough and no headaches     HPI Comments: ZACHARY LOVINS is a 32 y.o. female with h/o DVT and unspecified cancer (2008), who presents to the Emergency Department complaining of 12 hours of new, sudden onset, moderate nausea and vomiting. Pt states that she was fine until this morning. Two hours after onset, Pt began having moderate diarrhea containing spotted red blood. Pt now endorses fever, chills, and  mild spotted blood in urine. Pain non-radiating, mild, and secondary to emesis and diarrhea.  Symptoms have not been treated PTA. Pt denies headache, diaphoresis, weakness, cough, SOB and any other pain. Pt is a current everyday smoker, admitting alcohol and marijuana use. No sick contact.  PCP: Dr. Clinton Sawyer   Past Medical History  Diagnosis Date  . Anemia 2008  . Asthma 2010  . Gestational diabetes 2009, 2011  . DVT  (deep venous thrombosis)     location unspecified   . Cancer 2008    unspecified type    Past Surgical History  Procedure Laterality Date  . Cesarean section  2009, 2011    indication unknown  . Salpingoophorectomy  2007    indication is ovarian cyst, side unknown   . Dilation and curettage of uterus      Family History  Problem Relation Age of Onset  . Breast cancer Mother 110  . Breast cancer Maternal Grandmother 45  . Breast cancer Paternal Grandmother 34  . Hypertension Father   . Stroke Father   . Hyperlipidemia Father   . Heart failure Father   . Diabetes Father   . Osteoarthritis Father   . Cancer Mother   . Cancer Maternal Grandmother   . Cancer Paternal Grandmother     History  Substance Use Topics  . Smoking status: Current Some Day Smoker    Types: Cigarettes  . Smokeless tobacco: Not on file  . Alcohol Use: Yes    Review of Systems  Constitutional: Positive for chills. Negative for appetite change and fatigue.  HENT: Negative for congestion, sinus pressure and ear discharge.   Eyes: Negative for discharge.  Respiratory: Negative for cough.   Cardiovascular: Negative for chest pain.  Gastrointestinal: Positive for nausea, vomiting, diarrhea and blood in stool. Negative for abdominal pain.  Genitourinary: Positive for hematuria. Negative for frequency.  Musculoskeletal: Negative for back pain.  Skin: Negative  for rash.  Neurological: Negative for seizures and headaches.  Psychiatric/Behavioral: Negative for hallucinations.    Allergies  Tomato  Home Medications   Current Outpatient Rx  Name  Route  Sig  Dispense  Refill  . FLUoxetine (PROZAC) 40 MG capsule   Oral   Take 40 mg by mouth 2 (two) times daily.         . Multiple Vitamin (MULTIVITAMIN WITH MINERALS) TABS   Oral   Take 1 tablet by mouth daily.         Marland Kitchen etonogestrel (IMPLANON) 68 MG IMPL implant   Subcutaneous   Inject 1 each into the skin once. Implanted approx. September  2011           BP 142/83  Pulse 100  Temp(Src) 102.1 F (38.9 C) (Oral)  Resp 20  Ht 5\' 5"  (1.651 m)  Wt 315 lb 1 oz (142.911 kg)  BMI 52.43 kg/m2  SpO2 100%  LMP 12/05/2012  Physical Exam  Nursing note and vitals reviewed. Constitutional: She is oriented to person, place, and time. She appears well-developed.  HENT:  Head: Normocephalic.  Eyes: Conjunctivae and EOM are normal. No scleral icterus.  Neck: Neck supple. No thyromegaly present.  Cardiovascular: Normal rate and regular rhythm.  Exam reveals no gallop and no friction rub.   No murmur heard. Pulmonary/Chest: No stridor. She has no wheezes. She has no rales. She exhibits no tenderness.  Abdominal: She exhibits no distension. There is no tenderness. There is no rebound.  Musculoskeletal: Normal range of motion. She exhibits no edema.  Lymphadenopathy:    She has no cervical adenopathy.  Neurological: She is oriented to person, place, and time. Coordination normal.  Skin: No rash noted. No erythema.  Psychiatric: She has a normal mood and affect. Her behavior is normal.    ED Course  Procedures  DIAGNOSTIC STUDIES: Oxygen Saturation is 100% on room air, normal by my interpretation.    COORDINATION OF CARE: 14:71- Pt is febrile with Temp of 102.1 F 17:41- Evaluated Pt. Pt is awake, alert, and without distress. Pt appears uncomfortable. 17:45- Patient understand and agree with initial ED impression and plan with expectations set for ED visit.    Labs Reviewed  URINALYSIS, ROUTINE W REFLEX MICROSCOPIC - Abnormal; Notable for the following:    Glucose, UA 100 (*)    All other components within normal limits  CBC - Abnormal; Notable for the following:    WBC 15.1 (*)    MCV 77.5 (*)    MCH 25.2 (*)    All other components within normal limits  COMPREHENSIVE METABOLIC PANEL - Abnormal; Notable for the following:    Sodium 134 (*)    Glucose, Bld 172 (*)    Albumin 3.4 (*)    All other components within  normal limits  PREGNANCY, URINE   No results found.   No diagnosis found.    MDM        The chart was scribed for me under my direct supervision.  I personally performed the history, physical, and medical decision making and all procedures in the evaluation of this patient.Mia Lennert, MD 01/12/13 2031

## 2013-01-12 NOTE — ED Notes (Signed)
Pt states n/v/d since 4am. No meds for symptoms.

## 2013-01-24 ENCOUNTER — Encounter (HOSPITAL_COMMUNITY): Payer: Self-pay | Admitting: *Deleted

## 2013-01-24 ENCOUNTER — Emergency Department (HOSPITAL_COMMUNITY)
Admission: EM | Admit: 2013-01-24 | Discharge: 2013-01-24 | Disposition: A | Discharge status: Home / Self Care | Payer: Self-pay | Attending: Emergency Medicine | Admitting: Emergency Medicine

## 2013-01-24 DIAGNOSIS — F17200 Nicotine dependence, unspecified, uncomplicated: Secondary | ICD-10-CM | POA: Insufficient documentation

## 2013-01-24 DIAGNOSIS — Z862 Personal history of diseases of the blood and blood-forming organs and certain disorders involving the immune mechanism: Secondary | ICD-10-CM | POA: Insufficient documentation

## 2013-01-24 DIAGNOSIS — K0889 Other specified disorders of teeth and supporting structures: Secondary | ICD-10-CM

## 2013-01-24 DIAGNOSIS — J45909 Unspecified asthma, uncomplicated: Secondary | ICD-10-CM | POA: Insufficient documentation

## 2013-01-24 DIAGNOSIS — K029 Dental caries, unspecified: Secondary | ICD-10-CM | POA: Insufficient documentation

## 2013-01-24 DIAGNOSIS — Z8543 Personal history of malignant neoplasm of ovary: Secondary | ICD-10-CM | POA: Insufficient documentation

## 2013-01-24 DIAGNOSIS — Z8632 Personal history of gestational diabetes: Secondary | ICD-10-CM | POA: Insufficient documentation

## 2013-01-24 DIAGNOSIS — K089 Disorder of teeth and supporting structures, unspecified: Secondary | ICD-10-CM | POA: Insufficient documentation

## 2013-01-24 DIAGNOSIS — Z79899 Other long term (current) drug therapy: Secondary | ICD-10-CM | POA: Insufficient documentation

## 2013-01-24 MED ORDER — PENICILLIN V POTASSIUM 250 MG PO TABS: 250.0000 mg | ORAL_TABLET | Freq: Four times a day (QID) | ORAL | Status: DC

## 2013-01-24 MED ORDER — NAPROXEN 250 MG PO TABS: 250.0000 mg | ORAL_TABLET | Freq: Two times a day (BID) | ORAL | Status: DC

## 2013-01-24 MED ORDER — HYDROCODONE-ACETAMINOPHEN 5-325 MG PO TABS: ORAL_TABLET | ORAL | Status: DC

## 2013-01-24 NOTE — ED Provider Notes (Signed)
History     CSN: [id number]  Arrival date & time 01/24/13  4098   First MD Initiated Contact with Patient 01/24/13 [phone]      Chief Complaint  Patient presents with  . Dental Pain     HPI Pt was seen at 0850.  Per pt, c/o gradual onset and persistence of constant right upper and lower tooth "pain" since this morning.  Denies fevers, no intra-oral edema, no rash, no facial swelling, no dysphagia, no neck pain.   The condition is aggravated by nothing. The condition is relieved by nothing. The symptoms have been associated with no other complaints.      Past Medical History  Diagnosis Date  . Anemia 2008  . Asthma 2010  . Gestational diabetes 2009, 2011  . DVT (deep venous thrombosis)     location unspecified   . 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 Cesarean section  2009, 2011    indication unknown  . Salpingoophorectomy  2007    indication is ovarian cyst, side unknown   . Dilation and curettage of uterus      Family History  Problem Relation Age of Onset  . Breast cancer Mother 16  . Breast cancer Maternal Grandmother 45  . Breast cancer Paternal Grandmother 15  . Hypertension Father   . Stroke Father   . Hyperlipidemia Father   . Heart failure Father   . Diabetes Father   . Osteoarthritis Father   . Cancer Mother   . Cancer Maternal Grandmother   . Cancer Paternal Grandmother     History  Substance Use Topics  . Smoking status: Current Some Day Smoker    Types: Cigarettes  . Smokeless tobacco: Not on file  . Alcohol Use: Yes      Review of Systems ROS: Statement: All systems negative except as marked or noted in the HPI; Constitutional: Negative for fever and chills. ; ; Eyes: Negative for eye pain and discharge. ; ; ENMT: Positive for dental caries, dental hygiene poor and toothache. Negative for ear pain, bleeding gums, dental injury, facial deformity, facial swelling, hoarseness, nasal  congestion, sinus pressure, sore throat, throat swelling and tongue swollen. ; ; Cardiovascular: Negative for chest pain, palpitations, diaphoresis, dyspnea and peripheral edema. ; ; Respiratory: Negative for cough, wheezing and stridor. ; ; Gastrointestinal: Negative for nausea, vomiting, diarrhea and abdominal pain. ; ; Genitourinary: Negative for dysuria, flank pain and hematuria. ; ; Musculoskeletal: Negative for back pain and neck pain. ; ; Skin: Negative for rash and skin lesion. ; ; Neuro: Negative for headache, lightheadedness and neck stiffness. ;    Allergies  Tomato  Home Medications   Current Outpatient Rx  Name  Route  Sig  Dispense  Refill  . acetaminophen (TYLENOL) 500 MG tablet   Oral   Take 1,000 mg by mouth every 6 (six) hours as needed for pain.         Marland Kitchen FLUoxetine (PROZAC) 40 MG capsule   Oral   Take 40 mg by mouth 2 (two) times daily.         . Multiple Vitamin (MULTIVITAMIN WITH MINERALS) TABS   Oral   Take 1 tablet by mouth daily.         . naproxen sodium (ALEVE) 220 MG tablet   Oral   Take 220 mg by mouth 2 (two) times daily with a meal.         .  promethazine (PHENERGAN) 25 MG tablet   Oral   Take 1 tablet (25 mg total) by mouth every 6 (six) hours as needed for nausea.   20 tablet   0   . etonogestrel (IMPLANON) 68 MG IMPL implant   Subcutaneous   Inject 1 each into the skin once. Implanted approx. September 2011         . HYDROcodone-acetaminophen (NORCO/VICODIN) 5-325 MG per tablet      1 or 2 tabs PO q6 hours prn pain   14 tablet   0   . naproxen (NAPROSYN) 250 MG tablet   Oral   Take 1 tablet (250 mg total) by mouth 2 (two) times daily with a meal.   14 tablet   0   . penicillin v potassium (VEETID) 250 MG tablet   Oral   Take 1 tablet (250 mg total) by mouth 4 (four) times daily.   20 tablet   0     BP 132/87  Pulse 73  Temp(Src) 97.6 F (36.4 C) (Oral)  Resp 20  Ht 5\' 5"  (1.651 m)  Wt 320 lb (145.151 kg)  BMI  53.25 kg/m2  SpO2 99%  LMP 01/15/2013  Physical Exam 0855: Physical examination: Vital signs and O2 SAT: Reviewed; Constitutional: Well developed, Well nourished, Well hydrated, In no acute distress; Head and Face: Normocephalic, Atraumatic; Eyes: EOMI, PERRL, No scleral icterus; ENMT: Mouth and pharynx normal, Poor dentition, Widespread dental decay, Left TM normal, Right TM normal, Mucous membranes moist, +upper and lower right 2nd molars with extensive dental decay.  No gingival erythema, edema, fluctuance, or drainage.  No intra-oral edema. No submandibular or sublingual edema. No hoarse voice, no drooling, no stridor.  ; Neck: Supple, Full range of motion, No lymphadenopathy; Cardiovascular: Regular rate and rhythm, No murmur, rub, or gallop; Respiratory: Breath sounds clear & equal bilaterally, No rales, rhonchi, wheezes, Normal respiratory effort/excursion; Chest: Nontender, Movement normal; Extremities: Pulses normal, No tenderness, No edema; Neuro: AA&Ox3, Major CN grossly intact.  No gross focal motor or sensory deficits in extremities.; Skin: Color normal, No rash, No petechiae, Warm, Dry   ED Course  Procedures     MDM  MDM Reviewed: previous chart, nursing note and vitals     0900:  Pt encouraged to f/u with dentist or oral surgeon for her dental needs for good continuity of care and definitive treatment.  Verb understanding.         Laray Anger, DO 01/24/13 2045

## 2013-01-24 NOTE — ED Notes (Signed)
Right sided dental pain

## 2013-04-21 ENCOUNTER — Encounter (HOSPITAL_COMMUNITY): Payer: Self-pay | Admitting: *Deleted

## 2013-04-21 ENCOUNTER — Emergency Department (HOSPITAL_COMMUNITY)
Admission: EM | Admit: 2013-04-21 | Discharge: 2013-04-22 | Disposition: A | Discharge status: Home / Self Care | Payer: Self-pay | Attending: Emergency Medicine | Admitting: Emergency Medicine

## 2013-04-21 DIAGNOSIS — Z8742 Personal history of other diseases of the female genital tract: Secondary | ICD-10-CM | POA: Insufficient documentation

## 2013-04-21 DIAGNOSIS — J45909 Unspecified asthma, uncomplicated: Secondary | ICD-10-CM | POA: Insufficient documentation

## 2013-04-21 DIAGNOSIS — Z791 Long term (current) use of non-steroidal anti-inflammatories (NSAID): Secondary | ICD-10-CM | POA: Insufficient documentation

## 2013-04-21 DIAGNOSIS — Z86718 Personal history of other venous thrombosis and embolism: Secondary | ICD-10-CM | POA: Insufficient documentation

## 2013-04-21 DIAGNOSIS — Z792 Long term (current) use of antibiotics: Secondary | ICD-10-CM | POA: Insufficient documentation

## 2013-04-21 DIAGNOSIS — L03019 Cellulitis of unspecified finger: Secondary | ICD-10-CM | POA: Insufficient documentation

## 2013-04-21 DIAGNOSIS — Z862 Personal history of diseases of the blood and blood-forming organs and certain disorders involving the immune mechanism: Secondary | ICD-10-CM | POA: Insufficient documentation

## 2013-04-21 DIAGNOSIS — IMO0001 Reserved for inherently not codable concepts without codable children: Secondary | ICD-10-CM

## 2013-04-21 DIAGNOSIS — Z79899 Other long term (current) drug therapy: Secondary | ICD-10-CM | POA: Insufficient documentation

## 2013-04-21 DIAGNOSIS — F17200 Nicotine dependence, unspecified, uncomplicated: Secondary | ICD-10-CM | POA: Insufficient documentation

## 2013-04-21 NOTE — ED Notes (Signed)
Pt reporting pain and swelling in left hand.  Reports that she did have an infection in left middle finger, and did lance it previously.  Through day, hand has gotten more swollen and painful.

## 2013-04-22 MED ORDER — LIDOCAINE HCL (PF) 2 % IJ SOLN
10.0000 mL | Freq: Once | INTRAMUSCULAR | Status: AC
  Administered 2013-04-22: 10 mL
  Filled 2013-04-22: qty 10

## 2013-04-22 MED ORDER — OXYCODONE-ACETAMINOPHEN 5-325 MG PO TABS: 1.0000 | ORAL_TABLET | ORAL | Status: DC | PRN

## 2013-04-22 NOTE — ED Provider Notes (Signed)
CSN: [id number]     Arrival date & time 04/21/13  2313 History   First MD Initiated Contact with Patient 04/22/13 0111     Chief Complaint  Patient presents with  . Hand Pain   (Consider location/radiation/quality/duration/timing/severity/associated sxs/prior Treatment) Patient is a 32 y.o. female presenting with hand pain. The history is provided by the patient.  Hand Pain  She has had pain and swelling of the left third finger distal phalanx for the last 2-3 days. It got significantly worse today. Pain is now severe and she rates it at 9/10. Pain seems to radiate up her arm. She denies fever or chills. Denies any,. She thought there might have been pus there and someone attempted to lance it but was unsuccessful. Of note, no anesthetic was used when the person tried to lance it.  Past Medical History  Diagnosis Date  . Anemia 2008  . Asthma 2010  . Gestational diabetes 2009, 2011  . DVT (deep venous thrombosis)     location unspecified   . 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 Cesarean section  2009, 2011    indication unknown  . Salpingoophorectomy  2007    indication is ovarian cyst, side unknown   . Dilation and curettage of uterus     Family History  Problem Relation Age of Onset  . Breast cancer Mother 70  . Breast cancer Maternal Grandmother 45  . Breast cancer Paternal Grandmother 74  . Hypertension Father   . Stroke Father   . Hyperlipidemia Father   . Heart failure Father   . Diabetes Father   . Osteoarthritis Father   . Cancer Mother   . Cancer Maternal Grandmother   . Cancer Paternal Grandmother    History  Substance Use Topics  . Smoking status: Current Some Day Smoker    Types: Cigarettes  . Smokeless tobacco: Not on file  . Alcohol Use: Yes   OB History   Grav Para Term Preterm Abortions TAB SAB Ect Mult Living                 Review of Systems  All other systems reviewed and are  negative.    Allergies  Tomato  Home Medications   Current Outpatient Rx  Name  Route  Sig  Dispense  Refill  . acetaminophen (TYLENOL) 500 MG tablet   Oral   Take 1,000 mg by mouth every 6 (six) hours as needed for pain.         Marland Kitchen etonogestrel (IMPLANON) 68 MG IMPL implant   Subcutaneous   Inject 1 each into the skin once. Implanted approx. September 2011         . FLUoxetine (PROZAC) 40 MG capsule   Oral   Take 40 mg by mouth 2 (two) times daily.         Marland Kitchen HYDROcodone-acetaminophen (NORCO/VICODIN) 5-325 MG per tablet      1 or 2 tabs PO q6 hours prn pain   14 tablet   0   . Multiple Vitamin (MULTIVITAMIN WITH MINERALS) TABS   Oral   Take 1 tablet by mouth daily.         . naproxen (NAPROSYN) 250 MG tablet   Oral   Take 1 tablet (250 mg total) by mouth 2 (two) times daily with a meal.   14 tablet   0   . naproxen sodium (ALEVE) 220 MG tablet  Oral   Take 220 mg by mouth 2 (two) times daily with a meal.         . penicillin v potassium (VEETID) 250 MG tablet   Oral   Take 1 tablet (250 mg total) by mouth 4 (four) times daily.   20 tablet   0   . promethazine (PHENERGAN) 25 MG tablet   Oral   Take 1 tablet (25 mg total) by mouth every 6 (six) hours as needed for nausea.   20 tablet   0    BP 156/89  Pulse 75  Temp(Src) 98.4 F (36.9 C) (Oral)  Resp 18  Ht 5\' 5"  (1.651 m)  Wt 323 lb (146.512 kg)  BMI 53.75 kg/m2  SpO2 100%  LMP 04/09/2013 Physical Exam  Nursing note and vitals reviewed.  32 year old female, resting comfortably and in no acute distress. Vital signs are significant for hypertension with blood pressure 156/89. Oxygen saturation is 100%, which is normal. Head is normocephalic and atraumatic. PERRLA, EOMI. Oropharynx is clear. Neck is nontender and supple without adenopathy or JVD. Back is nontender and there is no CVA tenderness. Lungs are clear without rales, wheezes, or rhonchi. Chest is nontender. Heart has regular  rate and rhythm without murmur. Abdomen is soft, flat, nontender without masses or hepatosplenomegaly and peristalsis is normoactive. Extremities have no cyanosis or edema, full range of motion is present. Paronychia is present left third finger. Skin is warm and dry without rash. Neurologic: Mental status is normal, cranial nerves are intact, there are no motor or sensory deficits.   ED Course  Procedures (including critical care time) INCISION AND DRAINAGE Performed by: [email or web address],UEAVW Consent: Verbal consent obtained. Risks and benefits: risks, benefits and alternatives were discussed Type: abscess  Body area: Left third finger paronychia  Anesthesia: Digital block  Incision was made with a scissors.  Local anesthetic: lidocaine 2% without epinephrine  Anesthetic total: 10 ml  Complexity: complex Blunt dissection to break up loculations  Drainage: purulent  Drainage amount: Small   Packing material: None   Patient tolerance: Patient tolerated the procedure well with no immediate complications.     MDM   1. Paronychia of third finger, left    Paronychia of the left third finger. This will need incision and drainage after digital block is achieved.    Dione Booze, MD 04/22/13 [phone]

## 2013-05-04 MED FILL — Oxycodone w/ Acetaminophen Tab 5-325 MG: ORAL | Qty: 6 | Status: AC

## 2013-05-26 ENCOUNTER — Emergency Department (HOSPITAL_COMMUNITY): Payer: Self-pay

## 2013-05-26 ENCOUNTER — Emergency Department (HOSPITAL_COMMUNITY)
Admission: EM | Admit: 2013-05-26 | Discharge: 2013-05-26 | Disposition: A | Discharge status: Home / Self Care | Payer: Self-pay | Attending: Emergency Medicine | Admitting: Emergency Medicine

## 2013-05-26 ENCOUNTER — Encounter (HOSPITAL_COMMUNITY): Payer: Self-pay | Admitting: Emergency Medicine

## 2013-05-26 DIAGNOSIS — D649 Anemia, unspecified: Secondary | ICD-10-CM | POA: Insufficient documentation

## 2013-05-26 DIAGNOSIS — G43809 Other migraine, not intractable, without status migrainosus: Secondary | ICD-10-CM | POA: Insufficient documentation

## 2013-05-26 DIAGNOSIS — J45909 Unspecified asthma, uncomplicated: Secondary | ICD-10-CM | POA: Insufficient documentation

## 2013-05-26 DIAGNOSIS — Z86718 Personal history of other venous thrombosis and embolism: Secondary | ICD-10-CM | POA: Insufficient documentation

## 2013-05-26 DIAGNOSIS — Z8543 Personal history of malignant neoplasm of ovary: Secondary | ICD-10-CM | POA: Insufficient documentation

## 2013-05-26 DIAGNOSIS — Z79899 Other long term (current) drug therapy: Secondary | ICD-10-CM | POA: Insufficient documentation

## 2013-05-26 DIAGNOSIS — Z792 Long term (current) use of antibiotics: Secondary | ICD-10-CM | POA: Insufficient documentation

## 2013-05-26 DIAGNOSIS — Z791 Long term (current) use of non-steroidal anti-inflammatories (NSAID): Secondary | ICD-10-CM | POA: Insufficient documentation

## 2013-05-26 DIAGNOSIS — F17200 Nicotine dependence, unspecified, uncomplicated: Secondary | ICD-10-CM | POA: Insufficient documentation

## 2013-05-26 DIAGNOSIS — G43009 Migraine without aura, not intractable, without status migrainosus: Secondary | ICD-10-CM

## 2013-05-26 MED ORDER — DIPHENHYDRAMINE HCL 50 MG/ML IJ SOLN
25.0000 mg | Freq: Once | INTRAMUSCULAR | Status: AC
  Administered 2013-05-26: 25 mg via INTRAVENOUS
  Filled 2013-05-26: qty 1

## 2013-05-26 MED ORDER — KETOROLAC TROMETHAMINE 30 MG/ML IJ SOLN
30.0000 mg | Freq: Once | INTRAMUSCULAR | Status: AC
  Administered 2013-05-26: 30 mg via INTRAVENOUS
  Filled 2013-05-26: qty 1

## 2013-05-26 MED ORDER — METOCLOPRAMIDE HCL 5 MG/ML IJ SOLN
10.0000 mg | Freq: Once | INTRAMUSCULAR | Status: AC
  Administered 2013-05-26: 10 mg via INTRAVENOUS
  Filled 2013-05-26: qty 2

## 2013-05-26 MED ORDER — FENTANYL CITRATE 0.05 MG/ML IJ SOLN
100.0000 ug | Freq: Once | INTRAMUSCULAR | Status: AC
  Administered 2013-05-26: 100 ug via INTRAVENOUS
  Filled 2013-05-26: qty 2

## 2013-05-26 NOTE — ED Provider Notes (Addendum)
CSN: [id number]     Arrival date & time 05/26/13  0518 History   First MD Initiated Contact with Patient 05/26/13 [phone]     Chief Complaint  Patient presents with  . Headache   (Consider location/radiation/quality/duration/timing/severity/associated sxs/prior Treatment) HPI This is a 32 year old female who states she woke from sleep about 2 hours ago with severe right-sided headache. It is described as throbbing. She took Tylenol without relief. About 20 minutes later she developed pain and spasms of the entire right side of her body. There are no exacerbating or mitigating factors. There is mild photophobia and no nausea or vomiting. Motor function and sensation are intact. She's never had a similar headache.  Past Medical History  Diagnosis Date  . Anemia 2008  . Asthma 2010  . Gestational diabetes 2009, 2011  . DVT (deep venous thrombosis)     location unspecified   . 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 Cesarean section  2009, 2011    indication unknown  . Salpingoophorectomy  2007    indication is ovarian cyst, side unknown   . Dilation and curettage of uterus     Family History  Problem Relation Age of Onset  . Breast cancer Mother 62  . Breast cancer Maternal Grandmother 45  . Breast cancer Paternal Grandmother 79  . Hypertension Father   . Stroke Father   . Hyperlipidemia Father   . Heart failure Father   . Diabetes Father   . Osteoarthritis Father   . Cancer Mother   . Cancer Maternal Grandmother   . Cancer Paternal Grandmother    History  Substance Use Topics  . Smoking status: Current Some Day Smoker    Types: Cigarettes  . Smokeless tobacco: Not on file  . Alcohol Use: Yes   OB History   Grav Para Term Preterm Abortions TAB SAB Ect Mult Living                 Review of Systems  All other systems reviewed and are negative.    Allergies  Tomato  Home Medications   Current Outpatient Rx   Name  Route  Sig  Dispense  Refill  . acetaminophen (TYLENOL) 500 MG tablet   Oral   Take 1,000 mg by mouth every 6 (six) hours as needed for pain.         Marland Kitchen etonogestrel (IMPLANON) 68 MG IMPL implant   Subcutaneous   Inject 1 each into the skin once. Implanted approx. September 2011         . Multiple Vitamin (MULTIVITAMIN WITH MINERALS) TABS   Oral   Take 1 tablet by mouth daily.         . naproxen sodium (ALEVE) 220 MG tablet   Oral   Take 220 mg by mouth 2 (two) times daily with a meal.         . FLUoxetine (PROZAC) 40 MG capsule   Oral   Take 40 mg by mouth 2 (two) times daily.         Marland Kitchen HYDROcodone-acetaminophen (NORCO/VICODIN) 5-325 MG per tablet      1 or 2 tabs PO q6 hours prn pain   14 tablet   0   . naproxen (NAPROSYN) 250 MG tablet   Oral   Take 1 tablet (250 mg total) by mouth 2 (two) times daily with a meal.   14 tablet   0   .  oxyCODONE-acetaminophen (PERCOCET/ROXICET) 5-325 MG per tablet   Oral   Take 1 tablet by mouth every 4 (four) hours as needed for pain.   6 tablet   0   . penicillin v potassium (VEETID) 250 MG tablet   Oral   Take 1 tablet (250 mg total) by mouth 4 (four) times daily.   20 tablet   0   . promethazine (PHENERGAN) 25 MG tablet   Oral   Take 1 tablet (25 mg total) by mouth every 6 (six) hours as needed for nausea.   20 tablet   0    BP 134/81  Pulse 85  Temp(Src) 98.6 F (37 C) (Oral)  Resp 24  Ht 5\' 5"  (1.651 m)  Wt 316 lb (143.337 kg)  BMI 52.59 kg/m2  SpO2 98%  LMP 05/09/2013  Physical Exam General: Well-developed, obese female in no acute distress; appearance consistent with age of record HENT: normocephalic; atraumatic Eyes: pupils equal, round and reactive to light; extraocular muscles intact; papilledema Neck: supple Heart: regular rate and rhythm Lungs: clear to auscultation bilaterally Abdomen: soft; nondistended; nontender; bowel sounds present Extremities: No deformity; full range of  motion; pulses normal Neurologic: Awake, alert and oriented; motor function intact in all extremities and symmetric; no facial droop; normal coordination and speech; sensation equal bilaterally; negative Romberg; normal finger to nose; no visible fasciculations or palpable muscle spasms on the right side compared to the left Skin: Warm and dry Psychiatric: Anxious    ED Course  Procedures (including critical care time)  MDM  Nursing notes and vitals signs, including pulse oximetry, reviewed.  Summary of this visit's results, reviewed by myself:  Imaging Studies: Ct Head Wo Contrast  05/26/2013   CLINICAL DATA:  Right-sided headache  EXAM: CT HEAD WITHOUT CONTRAST  TECHNIQUE: Contiguous axial images were obtained from the base of the skull through the vertex without intravenous contrast.  COMPARISON:  12/31/ 2012  FINDINGS: Skull:No acute osseous abnormality. Circumferential mucosal thickening in the left maxillary antrum, increased from 2012.  Orbits: No acute abnormality.  Brain: No evidence of acute abnormality, such as acute infarction, hemorrhage, hydrocephalus, or mass lesion/mass effect.  IMPRESSION: 1. No evidence of acute intracranial disease. 2. Left maxillary inflammatory paranasal sinus disease.   Electronically Signed   By: Tiburcio Pea M.D.   On: 05/26/2013 06:59    7:02 AM Patient feeling better after IV medications. She is ambulated to the bathroom without difficulty. This is likely a migraine variant as its onset was acute and has responded to medications. There is no evidence of intracranial pathology.    Hanley Seamen, MD 05/26/13 8119  Hanley Seamen, MD 05/26/13 1478

## 2013-05-26 NOTE — ED Notes (Signed)
Patient ambulatory to bathroom without difficulty.

## 2013-05-26 NOTE — ED Notes (Signed)
Patient woke up with a headache on the right side and her right hand would cramp up per EMS.

## 2013-05-26 NOTE — ED Notes (Signed)
MD at bedside. 

## 2013-05-26 NOTE — ED Notes (Signed)
Patient with no complaints at this time. Respirations even and unlabored. Skin warm/dry. Discharge instructions reviewed with patient at this time. Patient given opportunity to voice concerns/ask questions. IV removed per policy and band-aid applied to site. Patient discharged at this time and left Emergency Department with steady gait.  

## 2014-01-27 ENCOUNTER — Encounter (HOSPITAL_COMMUNITY): Payer: Self-pay | Admitting: Emergency Medicine

## 2014-01-27 ENCOUNTER — Emergency Department (HOSPITAL_COMMUNITY)
Admission: EM | Admit: 2014-01-27 | Discharge: 2014-01-27 | Disposition: A | Discharge status: Home / Self Care | Payer: Self-pay | Attending: Emergency Medicine | Admitting: Emergency Medicine

## 2014-01-27 ENCOUNTER — Emergency Department (HOSPITAL_COMMUNITY): Payer: Self-pay

## 2014-01-27 DIAGNOSIS — J45909 Unspecified asthma, uncomplicated: Secondary | ICD-10-CM | POA: Insufficient documentation

## 2014-01-27 DIAGNOSIS — Z8543 Personal history of malignant neoplasm of ovary: Secondary | ICD-10-CM | POA: Insufficient documentation

## 2014-01-27 DIAGNOSIS — Z8632 Personal history of gestational diabetes: Secondary | ICD-10-CM | POA: Insufficient documentation

## 2014-01-27 DIAGNOSIS — K1121 Acute sialoadenitis: Secondary | ICD-10-CM

## 2014-01-27 DIAGNOSIS — Z792 Long term (current) use of antibiotics: Secondary | ICD-10-CM | POA: Insufficient documentation

## 2014-01-27 DIAGNOSIS — Z862 Personal history of diseases of the blood and blood-forming organs and certain disorders involving the immune mechanism: Secondary | ICD-10-CM | POA: Insufficient documentation

## 2014-01-27 DIAGNOSIS — Z86718 Personal history of other venous thrombosis and embolism: Secondary | ICD-10-CM | POA: Insufficient documentation

## 2014-01-27 DIAGNOSIS — R6883 Chills (without fever): Secondary | ICD-10-CM | POA: Insufficient documentation

## 2014-01-27 DIAGNOSIS — K1120 Sialoadenitis, unspecified: Secondary | ICD-10-CM | POA: Insufficient documentation

## 2014-01-27 LAB — CBC WITH DIFFERENTIAL/PLATELET
BASOS PCT: 0 % (ref 0–1)
Basophils Absolute: 0 10*3/uL (ref 0.0–0.1)
Eosinophils Absolute: 0 10*3/uL (ref 0.0–0.7)
Eosinophils Relative: 0 % (ref 0–5)
HCT: 40.1 % (ref 36.0–46.0)
HEMOGLOBIN: 13.3 g/dL (ref 12.0–15.0)
LYMPHS ABS: 1 10*3/uL (ref 0.7–4.0)
Lymphocytes Relative: 15 % (ref 12–46)
MCH: 26.7 pg (ref 26.0–34.0)
MCHC: 33.2 g/dL (ref 30.0–36.0)
MCV: 80.4 fL (ref 78.0–100.0)
MONOS PCT: 4 % (ref 3–12)
Monocytes Absolute: 0.3 10*3/uL (ref 0.1–1.0)
NEUTROS ABS: 5.7 10*3/uL (ref 1.7–7.7)
NEUTROS PCT: 81 % — AB (ref 43–77)
Platelets: 166 10*3/uL (ref 150–400)
RBC: 4.99 MIL/uL (ref 3.87–5.11)
RDW: 14.3 % (ref 11.5–15.5)
WBC: 7.1 10*3/uL (ref 4.0–10.5)

## 2014-01-27 LAB — BASIC METABOLIC PANEL
BUN: 7 mg/dL (ref 6–23)
CHLORIDE: 96 meq/L (ref 96–112)
CO2: 25 mEq/L (ref 19–32)
Calcium: 9.1 mg/dL (ref 8.4–10.5)
Creatinine, Ser: 0.67 mg/dL (ref 0.50–1.10)
GFR calc non Af Amer: >90 mL/min (ref >90)
Glucose, Bld: 183 mg/dL — ABNORMAL HIGH (ref 70–99)
POTASSIUM: 4 meq/L (ref 3.7–5.3)
Sodium: 134 mEq/L — ABNORMAL LOW (ref 137–147)

## 2014-01-27 MED ORDER — MORPHINE SULFATE 4 MG/ML IJ SOLN
4.0000 mg | Freq: Once | INTRAMUSCULAR | Status: AC
  Administered 2014-01-27: 4 mg via INTRAVENOUS
  Filled 2014-01-27: qty 1

## 2014-01-27 MED ORDER — HYDROCODONE-ACETAMINOPHEN 5-325 MG PO TABS: 2.0000 | ORAL_TABLET | ORAL | Status: DC | PRN

## 2014-01-27 MED ORDER — IOHEXOL 300 MG/ML  SOLN
80.0000 mL | Freq: Once | INTRAMUSCULAR | Status: AC | PRN
  Administered 2014-01-27: 80 mL via INTRAVENOUS

## 2014-01-27 MED ORDER — CLINDAMYCIN HCL 150 MG PO CAPS: 300.0000 mg | ORAL_CAPSULE | Freq: Four times a day (QID) | ORAL | Status: DC

## 2014-01-27 MED ORDER — CLINDAMYCIN PHOSPHATE 600 MG/50ML IV SOLN
600.0000 mg | Freq: Once | INTRAVENOUS | Status: AC
  Administered 2014-01-27: 600 mg via INTRAVENOUS
  Filled 2014-01-27: qty 50

## 2014-01-27 MED ORDER — ONDANSETRON HCL 4 MG/2ML IJ SOLN
4.0000 mg | Freq: Once | INTRAMUSCULAR | Status: AC
  Administered 2014-01-27: 4 mg via INTRAVENOUS
  Filled 2014-01-27: qty 2

## 2014-01-27 NOTE — Discharge Instructions (Signed)
Salivary Gland Infection A salivary gland infection can be caused by a virus, bacteria from the mouth, or a stone. Mumps and other viruses may settle in one or more of the saliva glands. This will result in swelling, pain, and difficulty eating. Bacteria may cause a more severe infection in a salivary gland. A salivary stone blocking the flow of saliva can make this worse. These infections may be related to other medical problems. Some of these are dehydration, recent surgery, poor nutrition, and some medications. TREATMENT  Treatment of a salivary gland infection depends on the cause. Mumps and other virus infections do not require antibiotics. If bacteria cause the infection, then antibiotics are needed to get rid of the infection. If there is a salivary stone blocking the duct, minor surgery to remove the stone may be needed.  HOME CARE INSTRUCTIONS   Get plenty of rest, increase your fluids, and use warm compresses on the swollen area for 15 to 20 minutes 4 times per day or as often as feels good to you.  Suck on hard candy or chew sugarless gum to promote saliva production.  Only take over-the-counter or prescription medicines for pain, discomfort, or fever as directed by your caregiver. SEEK IMMEDIATE MEDICAL CARE IF:   You have increased swelling or pain or pain not relieved with medications.  You develop chills or a fever.  Any of your problems are getting worse rather than better. Document Released: 09/02/2004 Document Revised: 10/18/2011 Document Reviewed: 07/26/2005 [facility] Patient Information 2015 [place], [place]. This information is not intended to replace advice given to you by your health care provider. Make sure you discuss any questions you have with your health care provider.

## 2014-01-27 NOTE — ED Provider Notes (Signed)
CSN: [id number]     Arrival date & time 01/27/14  1441 History  This chart was scribed for [name], by Marlowe Kays, ED Scribe. This patient was seen in room APA17/APA17 and the patient's care was started at 2:55 PM.  Chief Complaint  Patient presents with  . Facial Swelling   The history is provided by the patient. No language interpreter was used.   HPI Comments:  [name] is a 32 y.o. Obese female who presents to the Emergency Department complaining of right-sided facial swelling that started last night. Pt reports associated pain with swallowing and chills. She states she has a broken tooth that she has had for about one year but denies any increased pain around the tooth. She denies nausea, vomiting, diarrhea, or fever.   Past Medical History  Diagnosis Date  . Anemia 2008  . Asthma 2010  . Gestational diabetes 2009, 2011  . DVT (deep venous thrombosis)     location unspecified   . 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 Cesarean section  2009, 2011    indication unknown  . Salpingoophorectomy  2007    indication is ovarian cyst, side unknown   . Dilation and curettage of uterus     Family History  Problem Relation Age of Onset  . Breast cancer Mother 71  . Breast cancer Maternal Grandmother 71  . Breast cancer Paternal Grandmother 85  . Hypertension Father   . Stroke Father   . Hyperlipidemia Father   . Heart failure Father   . Diabetes Father   . Osteoarthritis Father   . Cancer Mother   . Cancer Maternal Grandmother   . Cancer Paternal Grandmother    History  Substance Use Topics  . Smoking status: Former Smoker    Types: Cigarettes  . Smokeless tobacco: Not on file  . Alcohol Use: No   OB History   Grav Para Term Preterm Abortions TAB SAB Ect Mult Living                 Review of Systems  Constitutional: Positive for chills. Negative for fever.  HENT: Positive for dental  problem and facial swelling. Negative for drooling and trouble swallowing.   Gastrointestinal: Negative for nausea, vomiting and diarrhea.  All other systems reviewed and are negative.   Allergies  Tomato  Home Medications   Prior to Admission medications   Medication Sig Start Date End Date Taking? Authorizing Tulio Facundo  acetaminophen (TYLENOL) 500 MG tablet Take 1,000 mg by mouth every 6 (six) hours as needed for pain.   Yes Historical Chardonnay Holzmann, MD  clindamycin (CLEOCIN) 150 MG capsule Take 2 capsules (300 mg total) by mouth 4 (four) times daily. 01/27/14   [name], MD  etonogestrel (IMPLANON) 68 MG IMPL implant Inject 1 each into the skin once. Implanted approx. September 2011    Historical Erasmus Bistline, MD  HYDROcodone-acetaminophen (NORCO/VICODIN) 5-325 MG per tablet Take 2 tablets by mouth every 4 (four) hours as needed for moderate pain. 01/27/14   [name], MD   Triage Vitals: BP 142/88  Pulse 88  Temp(Src) 97.9 F (36.6 C) (Oral)  Resp 18  Ht 5\' 4"  (1.626 m)  Wt 317 lb (143.79 kg)  BMI 54.39 kg/m2  SpO2 96%  LMP 01/02/2014 Physical Exam  Constitutional: She is oriented to person, place, and time. She appears well-developed and well-nourished. No distress.  HENT:  Head: Normocephalic and atraumatic.  Right Ear: Hearing normal.  Left Ear: Hearing normal.  Nose: Nose normal.  Mouth/Throat: Uvula is midline, oropharynx is clear and moist and mucous membranes are normal. No tonsillar abscesses.    Right submandibular tenderness, firmness, and swelling.  Eyes: Conjunctivae and EOM are normal. Pupils are equal, round, and reactive to light.  Neck: Normal range of motion. Neck supple.  Cardiovascular: Regular rhythm, S1 normal and S2 normal.  Exam reveals no gallop and no friction rub.   No murmur heard. Pulmonary/Chest: Effort normal and breath sounds normal. No respiratory distress. She exhibits no tenderness.  Abdominal: Soft. Normal appearance and  bowel sounds are normal. There is no hepatosplenomegaly. There is no tenderness. There is no rebound, no guarding, no tenderness at McBurney's point and negative Murphy's sign. No hernia.  Musculoskeletal: Normal range of motion.  Neurological: She is alert and oriented to person, place, and time. She has normal strength. No cranial nerve deficit or sensory deficit. Coordination normal. GCS eye subscore is 4. GCS verbal subscore is 5. GCS motor subscore is 6.  Skin: Skin is warm, dry and intact. No rash noted. No cyanosis.  Psychiatric: She has a normal mood and affect. Her speech is normal and behavior is normal. Thought content normal.    ED Course  Procedures (including critical care time) DIAGNOSTIC STUDIES: Oxygen Saturation is 96% on RA, adequate by my interpretation.   COORDINATION OF CARE: 2:57 PM- Will order CT scan and IV antibiotics. Pt verbalizes understanding and agrees to plan.  Medications  clindamycin (CLEOCIN) IVPB 600 mg (0 mg Intravenous Stopped 01/27/14 1633)  iohexol (OMNIPAQUE) 300 MG/ML solution 80 mL (80 mLs Intravenous Contrast Given 01/27/14 1549)  morphine 4 MG/ML injection 4 mg (4 mg Intravenous Given 01/27/14 1643)  ondansetron (ZOFRAN) injection 4 mg (4 mg Intravenous Given 01/27/14 1643)    Labs Review Labs Reviewed  CBC WITH DIFFERENTIAL - Abnormal; Notable for the following:    Neutrophils Relative % 81 (*)    All other components within normal limits  BASIC METABOLIC PANEL - Abnormal; Notable for the following:    Sodium 134 (*)    Glucose, Bld 183 (*)    All other components within normal limits    Imaging Review Ct Soft Tissue Neck W Contrast  01/27/2014   CLINICAL DATA:  right side FACIAL SWELLING right side FACIAL SWELLING  The  EXAM: CT NECK WITH CONTRAST  TECHNIQUE: Multidetector CT imaging of the neck was performed using the standard protocol following the bolus administration of intravenous contrast.  CONTRAST:  42mL OMNIPAQUE IOHEXOL 300 MG/ML   SOLN  COMPARISON:  Head CT dated 05/26/2013, head and maxillofacial CT 08/09/2011  FINDINGS: The skull base is unremarkable.  There is asymmetric enlargement of the right submandibular gland which demonstrates mild heterogeneous enhancement. There is inflammatory change within the surrounding fat and a small amount of fluid in the submandibular space. These findings extend into the peritonsillar region on the right. No drainable loculated fluid collections are appreciated.  There is mild heterogeneous enhancement of the tonsils on the right. Remaining tonsils and adenoids are unremarkable.  Diffuse enlarged lymph nodes appreciated within the anterior and posterior cervical chains largest in the carotid space region on the right measuring 11 mm in short axis.  The opacified vascular structures unremarkable. The parotid glands are symmetric.  The remaining spaces of the neck are maintained.  The airway is patent.  The musculature of the neck is symmetric.  An  air-fluid level and mucosal thickening within the left maxillary sinus. Mucosal thickening is appreciated within the ethmoid air cells and right maxillary sinus. The mastoid air cells are patent. The osseous structures are unremarkable. The lung apices are unremarkable.  IMPRESSION: Sialoadenitis of the right submandibular gland. No drainable associated loculated fluid collections. Surveillance evaluation status post there appears recommended to ensure resolution.  Adenopathy within the anterior and posterior cervical chains  Sinusitis within the left maxillary sinus with areas of sinus disease in the ethmoid air cells and right maxillary sinus.   Electronically Signed   By: Margaree Mackintosh M.D.   On: 01/27/2014 16:14     EKG Interpretation None      MDM   Final diagnoses:  Acute sialoadenitis    Patient presents to the ER for evaluation of pain and swelling on the right side of her neck. It is unclear where the swelling was coming from. Patient  does have a bad tooth in the region, but there was no significant tenderness or swelling of the tooth. A CAT scan was performed to further evaluate for possible abscess, mass, et Ronney Asters. It did confirm sialoadenitis. Patient will be treated with pain medication, antibiotic. She was given clindamycin here in the ER and continued on clindamycin orally. She will followup with ENT. She was told to use lemon drops, although no obvious stone was seen on the CAT scan. I personally performed the services described in this documentation, which was scribed in my presence. The recorded information has been reviewed and is accurate.    [name], MD 01/27/14 1753

## 2014-01-27 NOTE — ED Notes (Signed)
Pt reports has had broken tooth x1 year. Pt reports right sided facial swelling that started yesterday. Pt reports pain with swallowing and opening mouth. No drooling noted in triage. Airway patent.

## 2014-01-31 ENCOUNTER — Emergency Department (HOSPITAL_COMMUNITY): Payer: Self-pay

## 2014-01-31 ENCOUNTER — Encounter (HOSPITAL_COMMUNITY): Payer: Self-pay | Admitting: Emergency Medicine

## 2014-01-31 ENCOUNTER — Emergency Department (HOSPITAL_COMMUNITY)
Admission: EM | Admit: 2014-01-31 | Discharge: 2014-02-01 | Disposition: A | Discharge status: Home / Self Care | Payer: Self-pay | Attending: Emergency Medicine | Admitting: Emergency Medicine

## 2014-01-31 DIAGNOSIS — J029 Acute pharyngitis, unspecified: Secondary | ICD-10-CM | POA: Insufficient documentation

## 2014-01-31 DIAGNOSIS — J45901 Unspecified asthma with (acute) exacerbation: Secondary | ICD-10-CM | POA: Insufficient documentation

## 2014-01-31 DIAGNOSIS — Z792 Long term (current) use of antibiotics: Secondary | ICD-10-CM | POA: Insufficient documentation

## 2014-01-31 DIAGNOSIS — K1121 Acute sialoadenitis: Secondary | ICD-10-CM

## 2014-01-31 DIAGNOSIS — H9209 Otalgia, unspecified ear: Secondary | ICD-10-CM | POA: Insufficient documentation

## 2014-01-31 DIAGNOSIS — Z86718 Personal history of other venous thrombosis and embolism: Secondary | ICD-10-CM | POA: Insufficient documentation

## 2014-01-31 DIAGNOSIS — R220 Localized swelling, mass and lump, head: Secondary | ICD-10-CM | POA: Insufficient documentation

## 2014-01-31 DIAGNOSIS — R197 Diarrhea, unspecified: Secondary | ICD-10-CM | POA: Insufficient documentation

## 2014-01-31 DIAGNOSIS — Z862 Personal history of diseases of the blood and blood-forming organs and certain disorders involving the immune mechanism: Secondary | ICD-10-CM | POA: Insufficient documentation

## 2014-01-31 DIAGNOSIS — H5789 Other specified disorders of eye and adnexa: Secondary | ICD-10-CM | POA: Insufficient documentation

## 2014-01-31 DIAGNOSIS — Z8543 Personal history of malignant neoplasm of ovary: Secondary | ICD-10-CM | POA: Insufficient documentation

## 2014-01-31 DIAGNOSIS — K1120 Sialoadenitis, unspecified: Secondary | ICD-10-CM | POA: Insufficient documentation

## 2014-01-31 DIAGNOSIS — R221 Localized swelling, mass and lump, neck: Principal | ICD-10-CM

## 2014-01-31 DIAGNOSIS — R6883 Chills (without fever): Secondary | ICD-10-CM | POA: Insufficient documentation

## 2014-01-31 DIAGNOSIS — R509 Fever, unspecified: Secondary | ICD-10-CM | POA: Insufficient documentation

## 2014-01-31 DIAGNOSIS — N898 Other specified noninflammatory disorders of vagina: Secondary | ICD-10-CM | POA: Insufficient documentation

## 2014-01-31 DIAGNOSIS — M542 Cervicalgia: Secondary | ICD-10-CM | POA: Insufficient documentation

## 2014-01-31 DIAGNOSIS — Z8632 Personal history of gestational diabetes: Secondary | ICD-10-CM | POA: Insufficient documentation

## 2014-01-31 DIAGNOSIS — Z87891 Personal history of nicotine dependence: Secondary | ICD-10-CM | POA: Insufficient documentation

## 2014-01-31 LAB — BASIC METABOLIC PANEL
BUN: 14 mg/dL (ref 6–23)
CHLORIDE: 101 meq/L (ref 96–112)
CO2: 26 meq/L (ref 19–32)
CREATININE: 0.76 mg/dL (ref 0.50–1.10)
Calcium: 9.3 mg/dL (ref 8.4–10.5)
GFR calc Af Amer: >90 mL/min (ref >90)
GFR calc non Af Amer: >90 mL/min (ref >90)
GLUCOSE: 106 mg/dL — AB (ref 70–99)
Potassium: 4.2 mEq/L (ref 3.7–5.3)
Sodium: 141 mEq/L (ref 137–147)

## 2014-01-31 MED ORDER — HYDROMORPHONE HCL PF 1 MG/ML IJ SOLN
1.0000 mg | Freq: Once | INTRAMUSCULAR | Status: AC
  Administered 2014-01-31: 1 mg via INTRAVENOUS
  Filled 2014-01-31: qty 1

## 2014-01-31 MED ORDER — SODIUM CHLORIDE 0.9 % IV BOLUS (SEPSIS)
1000.0000 mL | Freq: Once | INTRAVENOUS | Status: AC
  Administered 2014-01-31: 1000 mL via INTRAVENOUS

## 2014-01-31 MED ORDER — SODIUM CHLORIDE 0.9 % IV SOLN
INTRAVENOUS | Status: DC
  Administered 2014-01-31: 1000 mL via INTRAVENOUS

## 2014-01-31 MED ORDER — IOHEXOL 300 MG/ML  SOLN
75.0000 mL | Freq: Once | INTRAMUSCULAR | Status: AC | PRN
Start: 2014-01-31 — End: 2014-01-31
  Administered 2014-01-31: 75 mL via INTRAVENOUS

## 2014-01-31 MED ORDER — ONDANSETRON HCL 4 MG/2ML IJ SOLN
4.0000 mg | Freq: Once | INTRAMUSCULAR | Status: AC
  Administered 2014-01-31: 4 mg via INTRAVENOUS
  Filled 2014-01-31: qty 2

## 2014-01-31 NOTE — ED Notes (Signed)
Pt with right facial swelling, seen here recently for dental abscess, trouble swallowing and SOB per pt, has f/u with [facility] and to see ENT on Thursday

## 2014-01-31 NOTE — ED Provider Notes (Addendum)
CSN: [id number]     Arrival date & time 01/31/14  1903 History   First MD Initiated Contact with Patient 01/31/14 2100    This chart was scribed for Fredia Sorrow, MD by Anastasia Pall, ED Scribe. This patient was seen in room APA11/APA11 and the patient's care was started at 9:43 PM.  Chief Complaint  Patient presents with  . Facial Swelling   The history is provided by the patient. No language interpreter was used.   HPI Comments: [name] is a 33 y.o. female who presents to the Emergency Department complaining of right facial swelling, with associated trouble swallowing and 9/10 pain severity, onset 6 days ago. Pt was prescribed Hydrocodone and Clindamycin for an infection of the salivary glands on 01/27/14. She was told to follow up if she had trouble swallowing. Patient states that she has not eaten for 5 days, and is barely able to drink due to her symptoms. Patient also states that she can no longer open her mouth completely. Pt denies her tongue is being swollen to the roof of mouth. She also reports associated fever, chills, right ear pain, rhinorrhea, congestion, headache, SOB, diarrhea with 20 episodes today, bilateral ankles swelling, and right sided neck pain. She denies any other associated symptoms.   Past Medical History  Diagnosis Date  . Anemia 2008  . Asthma 2010  . Gestational diabetes 2009, 2011  . DVT (deep venous thrombosis)     location unspecified   . 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 Cesarean section  2009, 2011    indication unknown  . Salpingoophorectomy  2007    indication is ovarian cyst, side unknown   . Dilation and curettage of uterus     Family History  Problem Relation Age of Onset  . Breast cancer Mother 1  . Breast cancer Maternal Grandmother 67  . Breast cancer Paternal Grandmother 55  . Hypertension Father   . Stroke Father   . Hyperlipidemia Father   . Heart failure Father    . Diabetes Father   . Osteoarthritis Father   . Cancer Mother   . Cancer Maternal Grandmother   . Cancer Paternal Grandmother    History  Substance Use Topics  . Smoking status: Former Smoker    Types: Cigarettes  . Smokeless tobacco: Not on file  . Alcohol Use: No   OB History   Grav Para Term Preterm Abortions TAB SAB Ect Mult Living                 Review of Systems  Constitutional: Positive for fever and chills.  HENT: Positive for congestion, ear pain (Right), rhinorrhea and sore throat.   Eyes: Negative for visual disturbance.  Respiratory: Negative for cough.   Cardiovascular: Positive for leg swelling (ankles). Negative for chest pain.  Gastrointestinal: Positive for diarrhea. Negative for nausea, vomiting and abdominal pain.  Genitourinary: Positive for vaginal bleeding (Consistent with cycle). Negative for dysuria.  Musculoskeletal: Positive for joint swelling and neck pain (Front right). Negative for back pain.  Skin: Negative for rash.  Hematological: Does not bruise/bleed easily.    Allergies  Tomato  Home Medications   Prior to Admission medications   Medication Sig Start Date End Date Taking? Authorizing Provider  acetaminophen (TYLENOL) 500 MG tablet Take 1,000 mg by mouth every 6 (six) hours as needed for pain.   Yes Historical Provider, MD  clindamycin (CLEOCIN)  150 MG capsule Take 2 capsules (300 mg total) by mouth 4 (four) times daily. 01/27/14  Yes [name], MD  clindamycin (CLEOCIN) 150 MG capsule Take 3 capsules (450 mg total) by mouth 3 (three) times daily. 02/01/14   Fredia Sorrow, MD  etonogestrel (IMPLANON) 68 MG IMPL implant Inject 1 each into the skin once. Implanted approx. September 2011    Historical Provider, MD  HYDROcodone-acetaminophen (NORCO/VICODIN) 5-325 MG per tablet Take 1-2 tablets by mouth every 6 (six) hours as needed. 02/01/14   Fredia Sorrow, MD   BP 129/87  Pulse 81  Temp(Src) 98 F (36.7 C) (Oral)  Resp  18  Ht 5\' 5"  (1.651 m)  Wt 310 lb (140.615 kg)  BMI 51.59 kg/m2  SpO2 98%  LMP 01/29/2014 Physical Exam  Nursing note and vitals reviewed. Constitutional: She is oriented to person, place, and time. She appears well-developed and well-nourished. No distress.  HENT:  Head: Normocephalic and atraumatic.  Right Ear: Hearing, tympanic membrane, external ear and ear canal normal.  Left Ear: Hearing, tympanic membrane, external ear and ear canal normal.  Mouth/Throat: Uvula is midline, oropharynx is clear and moist and mucous membranes are normal.  Submandibular swelling 3X 4 cm Cannot visualize back of throat  Eyes: Conjunctivae and EOM are normal. Right eye exhibits discharge.  Neck: Neck supple. No tracheal deviation present.  Cardiovascular: Normal rate, regular rhythm and normal heart sounds.   Pulmonary/Chest: Effort normal and breath sounds normal. No respiratory distress.  Abdominal: Soft. Bowel sounds are normal. There is no tenderness.  Musculoskeletal: Normal range of motion. She exhibits no edema.  Neurological: She is alert and oriented to person, place, and time. No cranial nerve deficit. She exhibits normal muscle tone. Coordination normal.  Skin: Skin is warm and dry.  Psychiatric: She has a normal mood and affect. Her behavior is normal.    ED Course  Procedures (including critical care time) DIAGNOSTIC STUDIES: Oxygen Saturation is 97% on RA, normal by my interpretation.    COORDINATION OF CARE: 9:48 PM-Discussed treatment plan which includes CT neck with pt at bedside and pt agreed to plan.   Medications  0.9 %  sodium chloride infusion (not administered)  clindamycin (CLEOCIN) IVPB 900 mg (900 mg Intravenous New Bag/Given 02/01/14 0136)  sodium chloride 0.9 % bolus 1,000 mL (1,000 mLs Intravenous New Bag/Given 01/31/14 2219)  ondansetron (ZOFRAN) injection 4 mg (4 mg Intravenous Given 01/31/14 2216)  HYDROmorphone (DILAUDID) injection 1 mg (1 mg Intravenous Given  01/31/14 2216)  iohexol (OMNIPAQUE) 300 MG/ML solution 75 mL (75 mLs Intravenous Contrast Given 01/31/14 2355)    Results for orders placed during the hospital encounter of 01/31/14  CBC WITH DIFFERENTIAL      Result Value Ref Range   WBC 8.1  4.0 - 10.5 K/uL   RBC 4.98  3.87 - 5.11 MIL/uL   Hemoglobin 13.1  12.0 - 15.0 g/dL   HCT 39.9  36.0 - 46.0 %   MCV 80.1  78.0 - 100.0 fL   MCH 26.3  26.0 - 34.0 pg   MCHC 32.8  30.0 - 36.0 g/dL   RDW 14.2  11.5 - 15.5 %   Platelets 269  150 - 400 K/uL   Neutrophils Relative % 65  43 - 77 %   Neutro Abs 5.3  1.7 - 7.7 K/uL   Lymphocytes Relative 27  12 - 46 %   Lymphs Abs 2.2  0.7 - 4.0 K/uL   Monocytes Relative 7  3 - 12 %   Monocytes Absolute 0.5  0.1 - 1.0 K/uL   Eosinophils Relative 1  0 - 5 %   Eosinophils Absolute 0.1  0.0 - 0.7 K/uL   Basophils Relative 0  0 - 1 %   Basophils Absolute 0.0  0.0 - 0.1 K/uL  BASIC METABOLIC PANEL      Result Value Ref Range   Sodium 141  137 - 147 mEq/L   Potassium 4.2  3.7 - 5.3 mEq/L   Chloride 101  96 - 112 mEq/L   CO2 26  19 - 32 mEq/L   Glucose, Bld 106 (*) 70 - 99 mg/dL   BUN 14  6 - 23 mg/dL   Creatinine, Ser 0.76  0.50 - 1.10 mg/dL   Calcium 9.3  8.4 - 10.5 mg/dL   GFR calc non Af Amer >90  >90 mL/min   GFR calc Af Amer >90  >90 mL/min     EKG Interpretation None       Results for orders placed during the hospital encounter of 01/31/14  CBC WITH DIFFERENTIAL      Result Value Ref Range   WBC 8.1  4.0 - 10.5 K/uL   RBC 4.98  3.87 - 5.11 MIL/uL   Hemoglobin 13.1  12.0 - 15.0 g/dL   HCT 39.9  36.0 - 46.0 %   MCV 80.1  78.0 - 100.0 fL   MCH 26.3  26.0 - 34.0 pg   MCHC 32.8  30.0 - 36.0 g/dL   RDW 14.2  11.5 - 15.5 %   Platelets 269  150 - 400 K/uL   Neutrophils Relative % 65  43 - 77 %   Neutro Abs 5.3  1.7 - 7.7 K/uL   Lymphocytes Relative 27  12 - 46 %   Lymphs Abs 2.2  0.7 - 4.0 K/uL   Monocytes Relative 7  3 - 12 %   Monocytes Absolute 0.5  0.1 - 1.0 K/uL   Eosinophils  Relative 1  0 - 5 %   Eosinophils Absolute 0.1  0.0 - 0.7 K/uL   Basophils Relative 0  0 - 1 %   Basophils Absolute 0.0  0.0 - 0.1 K/uL  BASIC METABOLIC PANEL      Result Value Ref Range   Sodium 141  137 - 147 mEq/L   Potassium 4.2  3.7 - 5.3 mEq/L   Chloride 101  96 - 112 mEq/L   CO2 26  19 - 32 mEq/L   Glucose, Bld 106 (*) 70 - 99 mg/dL   BUN 14  6 - 23 mg/dL   Creatinine, Ser 0.76  0.50 - 1.10 mg/dL   Calcium 9.3  8.4 - 10.5 mg/dL   GFR calc non Af Amer >90  >90 mL/min   GFR calc Af Amer >90  >90 mL/min   Ct Soft Tissue Neck W Contrast  02/01/2014   CLINICAL DATA:  Right neck pain and swelling.  Sore throat.  EXAM: CT NECK WITH CONTRAST  TECHNIQUE: Multidetector CT imaging of the neck was performed using the standard protocol following the bolus administration of intravenous contrast.  CONTRAST:  7mL OMNIPAQUE IOHEXOL 300 MG/ML  SOLN  COMPARISON:  01/27/2014  FINDINGS: Acute on chronic left maxillary sinusitis. Chronic right maxillary sinusitis and mild chronic ethmoid sinusitis.  Continued tonsillar prominence. Continued inflammatory findings from the right submandibular gland compatible with sialoadenitis. No abscess. Index right station 2 lymph node 1.2 cm in short axis, formerly 1.1  cm in short axis. Mild stranding tracks down along the right chain in both superficial and deep to the platysma muscle. There is also some reactive adenopathy on the contralateral side along with mildly enlarged submandibular lymph nodes, as before. Mild effacement of the right piriform sinus.  IMPRESSION: 1. Right sialoadenitis with continued reactive adenopathy in the neck, right greater than left. There is some slightly increased effacement of the right piriform sinus noted. No abscess identified. 2. Stable sinusitis.   Electronically Signed   By: Sherryl Barters M.D.   On: 02/01/2014 01:01   Ct Soft Tissue Neck W Contrast  01/27/2014   CLINICAL DATA:  right side FACIAL SWELLING right side FACIAL  SWELLING  The  EXAM: CT NECK WITH CONTRAST  TECHNIQUE: Multidetector CT imaging of the neck was performed using the standard protocol following the bolus administration of intravenous contrast.  CONTRAST:  60mL OMNIPAQUE IOHEXOL 300 MG/ML  SOLN  COMPARISON:  Head CT dated 05/26/2013, head and maxillofacial CT 08/09/2011  FINDINGS: The skull base is unremarkable.  There is asymmetric enlargement of the right submandibular gland which demonstrates mild heterogeneous enhancement. There is inflammatory change within the surrounding fat and a small amount of fluid in the submandibular space. These findings extend into the peritonsillar region on the right. No drainable loculated fluid collections are appreciated.  There is mild heterogeneous enhancement of the tonsils on the right. Remaining tonsils and adenoids are unremarkable.  Diffuse enlarged lymph nodes appreciated within the anterior and posterior cervical chains largest in the carotid space region on the right measuring 11 mm in short axis.  The opacified vascular structures unremarkable. The parotid glands are symmetric.  The remaining spaces of the neck are maintained.  The airway is patent.  The musculature of the neck is symmetric.  An air-fluid level and mucosal thickening within the left maxillary sinus. Mucosal thickening is appreciated within the ethmoid air cells and right maxillary sinus. The mastoid air cells are patent. The osseous structures are unremarkable. The lung apices are unremarkable.  IMPRESSION: Sialoadenitis of the right submandibular gland. No drainable associated loculated fluid collections. Surveillance evaluation status post there appears recommended to ensure resolution.  Adenopathy within the anterior and posterior cervical chains  Sinusitis within the left maxillary sinus with areas of sinus disease in the ethmoid air cells and right maxillary sinus.   Electronically Signed   By: Margaree Mackintosh M.D.   On: 01/27/2014 16:14       MDM   Final diagnoses:  Right facial swelling  Acute sialoadenitis     Patient was seen June 21 for right-sided facial swelling head CT scan soft tissue neck with contrast in the diagnosis was acute sialoadenitis. Patient was treated with antibiotic. Cleocin was antibiotic. Patient states not any better at all. Was referred to ear nose and throat for followup Dr. by mouth but is currently on vacation. Patient returns tonight because she feels things getting much worse. Patient has no swelling to the floor of her mouth. Patient's currently on appropriate antibiotic. Electrolytes without snignificant abnormalities. CBC is still pending. CT results of soft tissue neck repeat scan are still pending. Disposition be based on that.  CT scan results are back. No significant worsening no evidence of abscess. No significant airway compromise. Patient was only on a low dose of clindamycin. We'll give 900 mg here IV and then will discharge home on 4 and 53 times a day. Patient has followup with your nose and throat next week. Patient  return for new or worse symptoms.     I personally performed the services described in this documentation, which was scribed in my presence. The recorded information has been reviewed and is accurate.     Fredia Sorrow, MD 02/01/14 4320  Fredia Sorrow, MD 02/01/14 [phone]

## 2014-02-01 LAB — CBC WITH DIFFERENTIAL/PLATELET
Basophils Absolute: 0 10*3/uL (ref 0.0–0.1)
Basophils Relative: 0 % (ref 0–1)
Eosinophils Absolute: 0.1 10*3/uL (ref 0.0–0.7)
Eosinophils Relative: 1 % (ref 0–5)
HEMATOCRIT: 39.9 % (ref 36.0–46.0)
HEMOGLOBIN: 13.1 g/dL (ref 12.0–15.0)
LYMPHS ABS: 2.2 10*3/uL (ref 0.7–4.0)
LYMPHS PCT: 27 % (ref 12–46)
MCH: 26.3 pg (ref 26.0–34.0)
MCHC: 32.8 g/dL (ref 30.0–36.0)
MCV: 80.1 fL (ref 78.0–100.0)
MONO ABS: 0.5 10*3/uL (ref 0.1–1.0)
MONOS PCT: 7 % (ref 3–12)
NEUTROS ABS: 5.3 10*3/uL (ref 1.7–7.7)
Neutrophils Relative %: 65 % (ref 43–77)
Platelets: 269 10*3/uL (ref 150–400)
RBC: 4.98 MIL/uL (ref 3.87–5.11)
RDW: 14.2 % (ref 11.5–15.5)
WBC: 8.1 10*3/uL (ref 4.0–10.5)

## 2014-02-01 MED ORDER — CLINDAMYCIN PHOSPHATE 900 MG/50ML IV SOLN
900.0000 mg | Freq: Once | INTRAVENOUS | Status: AC
  Administered 2014-02-01: 900 mg via INTRAVENOUS
  Filled 2014-02-01: qty 50

## 2014-02-01 MED ORDER — HYDROMORPHONE HCL PF 1 MG/ML IJ SOLN
1.0000 mg | Freq: Once | INTRAMUSCULAR | Status: AC
  Administered 2014-02-01: 1 mg via INTRAVENOUS
  Filled 2014-02-01: qty 1

## 2014-02-01 MED ORDER — HYDROCODONE-ACETAMINOPHEN 5-325 MG PO TABS: 1.0000 | ORAL_TABLET | Freq: Four times a day (QID) | ORAL | Status: DC | PRN

## 2014-02-01 MED ORDER — CLINDAMYCIN HCL 150 MG PO CAPS: 450.0000 mg | ORAL_CAPSULE | Freq: Three times a day (TID) | ORAL | Status: DC

## 2014-02-01 NOTE — ED Notes (Signed)
Able to drink ginger ale 

## 2014-02-01 NOTE — Discharge Instructions (Signed)
Take antibiotic as directed. Return for any newer worse symptoms. You feel I can not improving return here in 2 days. Return earlier for worse symptoms. Keep your appointment with ear nose and throat. Stop the antibiotic you have now and start the new one. Stronger dose. Take pain medicine as needed. CT scan showed no significant the improvement and no significant worsening.

## 2014-02-07 ENCOUNTER — Ambulatory Visit (INDEPENDENT_AMBULATORY_CARE_PROVIDER_SITE_OTHER): Payer: Self-pay | Admitting: Otolaryngology

## 2014-02-07 DIAGNOSIS — K1120 Sialoadenitis, unspecified: Secondary | ICD-10-CM

## 2014-04-07 ENCOUNTER — Encounter (HOSPITAL_COMMUNITY): Payer: Self-pay | Admitting: Emergency Medicine

## 2014-04-07 ENCOUNTER — Emergency Department (HOSPITAL_COMMUNITY)
Admission: EM | Admit: 2014-04-07 | Discharge: 2014-04-07 | Disposition: A | Discharge status: Home / Self Care | Payer: Self-pay | Attending: Emergency Medicine | Admitting: Emergency Medicine

## 2014-04-07 DIAGNOSIS — F17200 Nicotine dependence, unspecified, uncomplicated: Secondary | ICD-10-CM | POA: Insufficient documentation

## 2014-04-07 DIAGNOSIS — R197 Diarrhea, unspecified: Secondary | ICD-10-CM | POA: Insufficient documentation

## 2014-04-07 DIAGNOSIS — R1110 Vomiting, unspecified: Secondary | ICD-10-CM | POA: Insufficient documentation

## 2014-04-07 DIAGNOSIS — Z86718 Personal history of other venous thrombosis and embolism: Secondary | ICD-10-CM | POA: Insufficient documentation

## 2014-04-07 DIAGNOSIS — R1084 Generalized abdominal pain: Secondary | ICD-10-CM | POA: Insufficient documentation

## 2014-04-07 DIAGNOSIS — K920 Hematemesis: Secondary | ICD-10-CM | POA: Insufficient documentation

## 2014-04-07 DIAGNOSIS — Z3202 Encounter for pregnancy test, result negative: Secondary | ICD-10-CM | POA: Insufficient documentation

## 2014-04-07 DIAGNOSIS — Z862 Personal history of diseases of the blood and blood-forming organs and certain disorders involving the immune mechanism: Secondary | ICD-10-CM | POA: Insufficient documentation

## 2014-04-07 DIAGNOSIS — J45909 Unspecified asthma, uncomplicated: Secondary | ICD-10-CM | POA: Insufficient documentation

## 2014-04-07 DIAGNOSIS — Z8543 Personal history of malignant neoplasm of ovary: Secondary | ICD-10-CM | POA: Insufficient documentation

## 2014-04-07 DIAGNOSIS — Z9889 Other specified postprocedural states: Secondary | ICD-10-CM | POA: Insufficient documentation

## 2014-04-07 LAB — I-STAT CHEM 8, ED
BUN: 6 mg/dL (ref 6–23)
CREATININE: 0.7 mg/dL (ref 0.50–1.10)
Calcium, Ion: 1.13 mmol/L (ref 1.12–1.23)
Chloride: 103 mEq/L (ref 96–112)
GLUCOSE: 134 mg/dL — AB (ref 70–99)
HCT: 37 % (ref 36.0–46.0)
Hemoglobin: 12.6 g/dL (ref 12.0–15.0)
Potassium: 3.3 mEq/L — ABNORMAL LOW (ref 3.7–5.3)
Sodium: 138 mEq/L (ref 137–147)
TCO2: 23 mmol/L (ref 0–100)

## 2014-04-07 LAB — URINALYSIS, ROUTINE W REFLEX MICROSCOPIC
BILIRUBIN URINE: NEGATIVE
Glucose, UA: >1000 mg/dL — AB
HGB URINE DIPSTICK: NEGATIVE
Ketones, ur: NEGATIVE mg/dL
Leukocytes, UA: NEGATIVE
Nitrite: NEGATIVE
Protein, ur: NEGATIVE mg/dL
Specific Gravity, Urine: 1.015 (ref 1.005–1.030)
UROBILINOGEN UA: 0.2 mg/dL (ref 0.0–1.0)
pH: 6 (ref 5.0–8.0)

## 2014-04-07 LAB — URINE MICROSCOPIC-ADD ON

## 2014-04-07 LAB — POC URINE PREG, ED: Preg Test, Ur: NEGATIVE

## 2014-04-07 MED ORDER — ONDANSETRON HCL 4 MG/2ML IJ SOLN
4.0000 mg | Freq: Once | INTRAMUSCULAR | Status: AC
  Administered 2014-04-07: 4 mg via INTRAVENOUS
  Filled 2014-04-07: qty 2

## 2014-04-07 MED ORDER — ONDANSETRON 8 MG PO TBDP
8.0000 mg | ORAL_TABLET | Freq: Once | ORAL | Status: AC
  Administered 2014-04-07: 8 mg via ORAL
  Filled 2014-04-07: qty 1

## 2014-04-07 MED ORDER — SODIUM CHLORIDE 0.9 % IV BOLUS (SEPSIS)
1000.0000 mL | Freq: Once | INTRAVENOUS | Status: AC
  Administered 2014-04-07: 1000 mL via INTRAVENOUS

## 2014-04-07 MED ORDER — ONDANSETRON HCL 8 MG PO TABS: 8.0000 mg | ORAL_TABLET | Freq: Three times a day (TID) | ORAL | Status: DC | PRN

## 2014-04-07 MED ORDER — MORPHINE SULFATE 2 MG/ML IJ SOLN
2.0000 mg | Freq: Once | INTRAMUSCULAR | Status: AC
  Administered 2014-04-07: 2 mg via INTRAVENOUS
  Filled 2014-04-07: qty 1

## 2014-04-07 NOTE — ED Notes (Signed)
Pt reports vomiting,diarrhea since 2am today. Pt reports generalized abdominal pain. Pt denies any known fevers. nad noted.

## 2014-04-07 NOTE — ED Provider Notes (Signed)
CSN: [id number]     Arrival date & time 04/07/14  1634 History  This chart was scribed for Sharyon Cable, MD by Peyton Bottoms, ED Scribe. This patient was seen in room APA05/APA05 and the patient's care was started at 4:56 PM.   Chief Complaint  Patient presents with  . Abdominal Pain   Patient is a 33 y.o. female presenting with abdominal pain. The history is provided by the patient. No language interpreter was used.  Abdominal Pain Pain location:  Generalized Pain quality: aching   Pain radiates to:  Does not radiate Pain severity:  Moderate Duration:  15 hours Context: alcohol use   Context: not recent travel   Relieved by:  Nothing Worsened by:  Nothing tried Ineffective treatments:  None tried Associated symptoms: diarrhea, hematemesis and vomiting   Associated symptoms: no cough and no fever     HPI Comments: [name] is a 33 y.o. female with a history of Factor V deficiency and gestational diabetes, who presents to the Emergency Department complaining of abdominal pain and emesis that began last night after going out with friends and having a mixed drink. Patient states she has had multiple episodes of emesis for the past 15 hours. Patient states she is "having the shakes" but denies associated fever, or cough. Patient also complains of associated emesis and diarrhea. She states that later episodes of emesis had some blood in them, but she states it may be due to irritation.  Patient is not currently taking any blood thinner medications. She denies any recent travel outside of the country.  Past Medical History  Diagnosis Date  . Anemia 2008  . Asthma 2010  . DVT (deep venous thrombosis)     location unspecified   . 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 Cesarean section  2009, 2011    indication unknown  . Salpingoophorectomy  2007    indication is ovarian cyst, side unknown   . Dilation and curettage  of uterus     Family History  Problem Relation Age of Onset  . Breast cancer Mother 73  . Breast cancer Maternal Grandmother 74  . Breast cancer Paternal Grandmother 38  . Hypertension Father   . Stroke Father   . Hyperlipidemia Father   . Heart failure Father   . Diabetes Father   . Osteoarthritis Father   . Cancer Mother   . Cancer Maternal Grandmother   . Cancer Paternal Grandmother    History  Substance Use Topics  . Smoking status: Current Every Day Smoker -- 0.25 packs/day    Types: Cigarettes  . Smokeless tobacco: Not on file  . Alcohol Use: Yes     Comment: occasional   OB History   Grav Para Term Preterm Abortions TAB SAB Ect Mult Living                 Review of Systems  Constitutional: Negative for fever.  Respiratory: Negative for cough.   Gastrointestinal: Positive for vomiting, abdominal pain, diarrhea and hematemesis.  All other systems reviewed and are negative.   Allergies  Tomato and Adhesive  Home Medications   Prior to Admission medications   Medication Sig Start Date End Date Taking? Authorizing Provider  Multiple Vitamin (MULTIVITAMIN WITH MINERALS) TABS tablet Take 1 tablet by mouth daily.   Yes Historical Provider, MD  etonogestrel (IMPLANON) 68 MG IMPL implant Inject 1 each into the  skin once. Implanted approx. September 2011    Historical Provider, MD   Triage Vitals: BP 152/84  Pulse 86  Temp(Src) 98.1 F (36.7 C) (Oral)  Resp 18  Ht 5\' 4"  (1.626 m)  Wt 315 lb (142.883 kg)  BMI 54.04 kg/m2  SpO2 99%  LMP 03/07/2014 Physical Exam  Nursing note and vitals reviewed.   CONSTITUTIONAL: Well developed/well nourished HEAD: Normocephalic/atraumatic EYES: EOMI/PERRL, no icterus ENMT: Mucous membranes dry NECK: supple no meningeal signs SPINE:entire spine nontender CV: S1/S2 noted, no murmurs/rubs/gallops noted LUNGS: Lungs are clear to auscultation bilaterally, no apparent distress ABDOMEN: soft, nontender, no rebound or  guarding GU:no cva tenderness NEURO: Pt is awake/alert, moves all extremitiesx4 EXTREMITIES: pulses normal, full ROM SKIN: warm, color normal PSYCH: no abnormalities of mood noted  ED Course  Procedures  DIAGNOSTIC STUDIES: Oxygen Saturation is 99% on RA, normal by my interpretation.    COORDINATION OF CARE: 5:02 PM- Discussed plans to treat with oral medications. Pt advised of plan for treatment and pt agrees.  Pt improved with IV fluids and zofran She is well appearing I doubt acute abdominal emergency Suspect viral Gastroenteritis with vomiting and diarrhea Stable for d/c home BP 126/79  Pulse 73  Temp(Src) 98.2 F (36.8 C) (Oral)  Resp 18  Ht 5\' 4"  (1.626 m)  Wt 315 lb (142.883 kg)  BMI 54.04 kg/m2  SpO2 97%  LMP 03/07/2014  Labs Review Labs Reviewed  URINALYSIS, ROUTINE W REFLEX MICROSCOPIC - Abnormal; Notable for the following:    Glucose, UA >1000 (*)    All other components within normal limits  I-STAT CHEM 8, ED - Abnormal; Notable for the following:    Potassium 3.3 (*)    Glucose, Bld 134 (*)    All other components within normal limits  URINE MICROSCOPIC-ADD ON  POC URINE PREG, ED      MDM   Final diagnoses:  Vomiting and diarrhea    Nursing notes including past medical history and social history reviewed and considered in documentation Labs/vital reviewed and considered   I personally performed the services described in this documentation, which was scribed in my presence. The recorded information has been reviewed and is accurate.       Sharyon Cable, MD 04/07/14 2226

## 2014-07-13 ENCOUNTER — Encounter (HOSPITAL_COMMUNITY): Payer: Self-pay | Admitting: *Deleted

## 2014-07-13 ENCOUNTER — Emergency Department (HOSPITAL_COMMUNITY)
Admission: EM | Admit: 2014-07-13 | Discharge: 2014-07-13 | Disposition: A | Discharge status: Home / Self Care | Payer: Managed Care, Other (non HMO) | Attending: Emergency Medicine | Admitting: Emergency Medicine

## 2014-07-13 DIAGNOSIS — Z720 Tobacco use: Secondary | ICD-10-CM | POA: Insufficient documentation

## 2014-07-13 DIAGNOSIS — Z79899 Other long term (current) drug therapy: Secondary | ICD-10-CM | POA: Diagnosis not present

## 2014-07-13 DIAGNOSIS — D649 Anemia, unspecified: Secondary | ICD-10-CM | POA: Diagnosis not present

## 2014-07-13 DIAGNOSIS — R030 Elevated blood-pressure reading, without diagnosis of hypertension: Secondary | ICD-10-CM | POA: Diagnosis present

## 2014-07-13 DIAGNOSIS — Z8543 Personal history of malignant neoplasm of ovary: Secondary | ICD-10-CM | POA: Diagnosis not present

## 2014-07-13 DIAGNOSIS — Z86718 Personal history of other venous thrombosis and embolism: Secondary | ICD-10-CM | POA: Diagnosis not present

## 2014-07-13 DIAGNOSIS — R42 Dizziness and giddiness: Secondary | ICD-10-CM | POA: Insufficient documentation

## 2014-07-13 DIAGNOSIS — R51 Headache: Secondary | ICD-10-CM | POA: Insufficient documentation

## 2014-07-13 DIAGNOSIS — R519 Headache, unspecified: Secondary | ICD-10-CM

## 2014-07-13 DIAGNOSIS — J45909 Unspecified asthma, uncomplicated: Secondary | ICD-10-CM | POA: Diagnosis not present

## 2014-07-13 DIAGNOSIS — Z3202 Encounter for pregnancy test, result negative: Secondary | ICD-10-CM | POA: Insufficient documentation

## 2014-07-13 LAB — CBC WITH DIFFERENTIAL/PLATELET
BASOS ABS: 0 10*3/uL (ref 0.0–0.1)
Basophils Relative: 0 % (ref 0–1)
EOS ABS: 0.2 10*3/uL (ref 0.0–0.7)
EOS PCT: 2 % (ref 0–5)
HCT: 38.2 % (ref 36.0–46.0)
Hemoglobin: 12.3 g/dL (ref 12.0–15.0)
Lymphocytes Relative: 23 % (ref 12–46)
Lymphs Abs: 2.2 10*3/uL (ref 0.7–4.0)
MCH: 26.2 pg (ref 26.0–34.0)
MCHC: 32.2 g/dL (ref 30.0–36.0)
MCV: 81.4 fL (ref 78.0–100.0)
Monocytes Absolute: 0.5 10*3/uL (ref 0.1–1.0)
Monocytes Relative: 5 % (ref 3–12)
Neutro Abs: 6.7 10*3/uL (ref 1.7–7.7)
Neutrophils Relative %: 70 % (ref 43–77)
PLATELETS: 222 10*3/uL (ref 150–400)
RBC: 4.69 MIL/uL (ref 3.87–5.11)
RDW: 13.8 % (ref 11.5–15.5)
WBC: 9.5 10*3/uL (ref 4.0–10.5)

## 2014-07-13 LAB — URINALYSIS, ROUTINE W REFLEX MICROSCOPIC
Bilirubin Urine: NEGATIVE
GLUCOSE, UA: 250 mg/dL — AB
KETONES UR: NEGATIVE mg/dL
LEUKOCYTES UA: NEGATIVE
Nitrite: NEGATIVE
PROTEIN: NEGATIVE mg/dL
Specific Gravity, Urine: >1.03 — ABNORMAL HIGH (ref 1.005–1.030)
Urobilinogen, UA: 0.2 mg/dL (ref 0.0–1.0)
pH: 6 (ref 5.0–8.0)

## 2014-07-13 LAB — URINE MICROSCOPIC-ADD ON

## 2014-07-13 LAB — BASIC METABOLIC PANEL
ANION GAP: 12 (ref 5–15)
BUN: 16 mg/dL (ref 6–23)
CALCIUM: 9.3 mg/dL (ref 8.4–10.5)
CO2: 24 mEq/L (ref 19–32)
CREATININE: 0.71 mg/dL (ref 0.50–1.10)
Chloride: 101 mEq/L (ref 96–112)
GFR calc Af Amer: >90 mL/min (ref >90)
GLUCOSE: 158 mg/dL — AB (ref 70–99)
Potassium: 4 mEq/L (ref 3.7–5.3)
SODIUM: 137 meq/L (ref 137–147)

## 2014-07-13 LAB — PREGNANCY, URINE: Preg Test, Ur: NEGATIVE

## 2014-07-13 MED ORDER — DIPHENHYDRAMINE HCL 50 MG/ML IJ SOLN
50.0000 mg | Freq: Once | INTRAMUSCULAR | Status: AC
  Administered 2014-07-13: 50 mg via INTRAVENOUS
  Filled 2014-07-13: qty 1

## 2014-07-13 MED ORDER — TRAMADOL HCL 50 MG PO TABS: 50.0000 mg | ORAL_TABLET | Freq: Four times a day (QID) | ORAL | Status: DC | PRN

## 2014-07-13 MED ORDER — KETOROLAC TROMETHAMINE 30 MG/ML IJ SOLN
30.0000 mg | Freq: Once | INTRAMUSCULAR | Status: AC
  Administered 2014-07-13: 30 mg via INTRAVENOUS
  Filled 2014-07-13: qty 1

## 2014-07-13 MED ORDER — METOCLOPRAMIDE HCL 5 MG/ML IJ SOLN
10.0000 mg | Freq: Once | INTRAMUSCULAR | Status: AC
  Administered 2014-07-13: 10 mg via INTRAVENOUS
  Filled 2014-07-13: qty 2

## 2014-07-13 NOTE — ED Notes (Addendum)
Pt c/o high blood pressure all day. Pt also states she felt clammy, cold and dizzy headed. Pt also c/o having a migraine.

## 2014-07-13 NOTE — ED Notes (Signed)
EDP at bedside  

## 2014-07-13 NOTE — Discharge Instructions (Signed)
Follow up with your md next week for recheck of bp

## 2014-07-13 NOTE — ED Provider Notes (Signed)
CSN: [id number]     Arrival date & time 07/13/14  1940 History  This chart was scribed for Maudry Diego, MD by Stephania Fragmin, ED Scribe. This patient was seen in room APA15/APA15 and the patient's care was started at 9:16 PM.      Chief Complaint  Patient presents with  . Hypertension   Patient is a 33 y.o. female presenting with hypertension. The history is provided by the patient. No language interpreter was used.  Hypertension This is a new problem. The current episode started 6 to 12 hours ago. The problem occurs constantly. The problem has been gradually worsening. Associated symptoms include headaches. Pertinent negatives include no chest pain and no abdominal pain. Nothing aggravates the symptoms. Nothing relieves the symptoms. She has tried nothing for the symptoms.    HPI Comments: [name] is a 33 y.o. female who presents to the Emergency Department complaining of a constant, throbbing, worsening, left-sided headache that began 9 hours ago when she woke up to go to work. She said that she felt lightheaded and "spotty" in her eyes. Before work, she checked her BP, at a reading of 175/110. After work, she rechecked her blood pressure which was 200/120. She reports that she had migraines before only when she was pregnant with her second child. She denies ever needing to take hypertension medication; she even adds that her blood pressure used to be on the low side, at around 100/60. She checks her blood pressure every 2 weeks for a wellness check at work. Her known allergies are tomatoes, grass, and adhesive tape.  Past Medical History  Diagnosis Date  . Anemia 2008  . Asthma 2010  . DVT (deep venous thrombosis)     location unspecified   . 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 Cesarean section  2009, 2011    indication unknown  . Salpingoophorectomy  2007    indication is ovarian cyst, side unknown   . Dilation and  curettage of uterus     Family History  Problem Relation Age of Onset  . Breast cancer Mother 61  . Breast cancer Maternal Grandmother 40  . Breast cancer Paternal Grandmother 78  . Hypertension Father   . Stroke Father   . Hyperlipidemia Father   . Heart failure Father   . Diabetes Father   . Osteoarthritis Father   . Cancer Mother   . Cancer Maternal Grandmother   . Cancer Paternal Grandmother    History  Substance Use Topics  . Smoking status: Current Every Day Smoker -- 0.25 packs/day    Types: Cigarettes  . Smokeless tobacco: Not on file  . Alcohol Use: Yes     Comment: occasional   OB History    No data available     Review of Systems  Constitutional: Negative for appetite change and fatigue.  HENT: Negative for congestion, ear discharge and sinus pressure.   Eyes: Negative for discharge.  Respiratory: Negative for cough.   Cardiovascular: Negative for chest pain.  Gastrointestinal: Negative for abdominal pain and diarrhea.  Genitourinary: Negative for frequency and hematuria.  Musculoskeletal: Negative for back pain.  Skin: Negative for rash.  Neurological: Positive for dizziness, light-headedness and headaches. Negative for seizures.  Psychiatric/Behavioral: Negative for hallucinations.      Allergies  Tomato and Adhesive  Home Medications   Prior to Admission medications   Medication Sig Start Date End Date Taking?  Authorizing Provider  etonogestrel (IMPLANON) 68 MG IMPL implant Inject 1 each into the skin once. Implanted approx. September 2011    Historical Provider, MD  Multiple Vitamin (MULTIVITAMIN WITH MINERALS) TABS tablet Take 1 tablet by mouth daily.    Historical Provider, MD  ondansetron (ZOFRAN) 8 MG tablet Take 1 tablet (8 mg total) by mouth every 8 (eight) hours as needed. 04/07/14   Sharyon Cable, MD   BP 170/102 mmHg  Pulse 74  Temp(Src) 98.1 F (36.7 C) (Oral)  Resp 18  Ht 5\' 5"  (1.651 m)  Wt 322 lb 1 oz (146.087 kg)  BMI  53.59 kg/m2  SpO2 100%  LMP 07/13/2014 Physical Exam  Constitutional: She is oriented to person, place, and time. She appears well-developed.  HENT:  Head: Normocephalic.  Eyes: Conjunctivae and EOM are normal. No scleral icterus.  Neck: Neck supple. No thyromegaly present.  Cardiovascular: Normal rate and regular rhythm.  Exam reveals no gallop and no friction rub.   No murmur heard. Pulmonary/Chest: No stridor. She has no wheezes. She has no rales. She exhibits no tenderness.  Abdominal: She exhibits no distension. There is no tenderness. There is no rebound.  Musculoskeletal: Normal range of motion. She exhibits no edema.  Lymphadenopathy:    She has no cervical adenopathy.  Neurological: She is oriented to person, place, and time. She exhibits normal muscle tone. Coordination normal.  Skin: No rash noted. No erythema.  Psychiatric: She has a normal mood and affect. Her behavior is normal.  Nursing note and vitals reviewed.   ED Course  Procedures (including critical care time)  DIAGNOSTIC STUDIES: Oxygen Saturation is 100% on room air, normal by my interpretation.    COORDINATION OF CARE: 9:17 PM - Discussed treatment plan with pt at bedside which includes medication and pt agreed to plan.  Labs Review Labs Reviewed  CBC WITH DIFFERENTIAL  BASIC METABOLIC PANEL  URINALYSIS, ROUTINE W REFLEX MICROSCOPIC  PREGNANCY, URINE    Imaging Review No results found.   EKG Interpretation None      MDM   Final diagnoses:  None    The chart was scribed for me under my direct supervision.  I personally performed the history, physical, and medical decision making and all procedures in the evaluation of this patient.Maudry Diego, MD 07/13/14 [phone]

## 2014-10-08 ENCOUNTER — Emergency Department (HOSPITAL_COMMUNITY): Payer: Managed Care, Other (non HMO)

## 2014-10-08 ENCOUNTER — Encounter (HOSPITAL_COMMUNITY): Payer: Self-pay | Admitting: Emergency Medicine

## 2014-10-08 ENCOUNTER — Emergency Department (HOSPITAL_COMMUNITY)
Admission: EM | Admit: 2014-10-08 | Discharge: 2014-10-08 | Disposition: A | Discharge status: Home / Self Care | Payer: Managed Care, Other (non HMO) | Attending: Emergency Medicine | Admitting: Emergency Medicine

## 2014-10-08 DIAGNOSIS — W01198A Fall on same level from slipping, tripping and stumbling with subsequent striking against other object, initial encounter: Secondary | ICD-10-CM | POA: Insufficient documentation

## 2014-10-08 DIAGNOSIS — Y9289 Other specified places as the place of occurrence of the external cause: Secondary | ICD-10-CM | POA: Diagnosis not present

## 2014-10-08 DIAGNOSIS — S161XXA Strain of muscle, fascia and tendon at neck level, initial encounter: Secondary | ICD-10-CM | POA: Insufficient documentation

## 2014-10-08 DIAGNOSIS — J45909 Unspecified asthma, uncomplicated: Secondary | ICD-10-CM | POA: Diagnosis not present

## 2014-10-08 DIAGNOSIS — W19XXXA Unspecified fall, initial encounter: Secondary | ICD-10-CM

## 2014-10-08 DIAGNOSIS — Z86718 Personal history of other venous thrombosis and embolism: Secondary | ICD-10-CM | POA: Insufficient documentation

## 2014-10-08 DIAGNOSIS — Z8543 Personal history of malignant neoplasm of ovary: Secondary | ICD-10-CM | POA: Diagnosis not present

## 2014-10-08 DIAGNOSIS — N939 Abnormal uterine and vaginal bleeding, unspecified: Secondary | ICD-10-CM

## 2014-10-08 DIAGNOSIS — Y9389 Activity, other specified: Secondary | ICD-10-CM | POA: Diagnosis not present

## 2014-10-08 DIAGNOSIS — S0990XA Unspecified injury of head, initial encounter: Secondary | ICD-10-CM | POA: Insufficient documentation

## 2014-10-08 DIAGNOSIS — Y998 Other external cause status: Secondary | ICD-10-CM | POA: Insufficient documentation

## 2014-10-08 DIAGNOSIS — Z862 Personal history of diseases of the blood and blood-forming organs and certain disorders involving the immune mechanism: Secondary | ICD-10-CM | POA: Insufficient documentation

## 2014-10-08 LAB — CBC WITH DIFFERENTIAL/PLATELET
BASOS PCT: 0 % (ref 0–1)
Basophils Absolute: 0 10*3/uL (ref 0.0–0.1)
EOS ABS: 0.1 10*3/uL (ref 0.0–0.7)
EOS PCT: 1 % (ref 0–5)
HEMATOCRIT: 39.3 % (ref 36.0–46.0)
Hemoglobin: 12.7 g/dL (ref 12.0–15.0)
Lymphocytes Relative: 19 % (ref 12–46)
Lymphs Abs: 1.4 10*3/uL (ref 0.7–4.0)
MCH: 26.2 pg (ref 26.0–34.0)
MCHC: 32.3 g/dL (ref 30.0–36.0)
MCV: 81.2 fL (ref 78.0–100.0)
MONO ABS: 0.5 10*3/uL (ref 0.1–1.0)
Monocytes Relative: 8 % (ref 3–12)
Neutro Abs: 5 10*3/uL (ref 1.7–7.7)
Neutrophils Relative %: 71 % (ref 43–77)
Platelets: 271 10*3/uL (ref 150–400)
RBC: 4.84 MIL/uL (ref 3.87–5.11)
RDW: 13.4 % (ref 11.5–15.5)
WBC: 7 10*3/uL (ref 4.0–10.5)

## 2014-10-08 LAB — COMPREHENSIVE METABOLIC PANEL
ALT: 31 U/L (ref 0–35)
ANION GAP: 7 (ref 5–15)
AST: 21 U/L (ref 0–37)
Albumin: 3.7 g/dL (ref 3.5–5.2)
Alkaline Phosphatase: 60 U/L (ref 39–117)
BILIRUBIN TOTAL: 0.5 mg/dL (ref 0.3–1.2)
BUN: 12 mg/dL (ref 6–23)
CHLORIDE: 105 mmol/L (ref 96–112)
CO2: 24 mmol/L (ref 19–32)
Calcium: 8.8 mg/dL (ref 8.4–10.5)
Creatinine, Ser: 0.72 mg/dL (ref 0.50–1.10)
GFR calc Af Amer: >90 mL/min (ref >90)
GFR calc non Af Amer: >90 mL/min (ref >90)
Glucose, Bld: 204 mg/dL — ABNORMAL HIGH (ref 70–99)
POTASSIUM: 3.8 mmol/L (ref 3.5–5.1)
Sodium: 136 mmol/L (ref 135–145)
Total Protein: 7.3 g/dL (ref 6.0–8.3)

## 2014-10-08 LAB — CBG MONITORING, ED: GLUCOSE-CAPILLARY: 181 mg/dL — AB (ref 70–99)

## 2014-10-08 MED ORDER — OXYCODONE-ACETAMINOPHEN 5-325 MG PO TABS: 1.0000 | ORAL_TABLET | Freq: Four times a day (QID) | ORAL | Status: DC | PRN

## 2014-10-08 MED ORDER — OXYCODONE-ACETAMINOPHEN 5-325 MG PO TABS
2.0000 | ORAL_TABLET | Freq: Once | ORAL | Status: AC
  Administered 2014-10-08: 2 via ORAL
  Filled 2014-10-08: qty 2

## 2014-10-08 MED ORDER — CYCLOBENZAPRINE HCL 10 MG PO TABS: 10.0000 mg | ORAL_TABLET | Freq: Three times a day (TID) | ORAL | Status: DC | PRN

## 2014-10-08 NOTE — ED Notes (Signed)
Patient c/o neck and back pain with headache. Per patient fell Friday getting out of shower. Patient states "back hit side of tub, unsure of where neck hit and if she hit head." Unsure of LOC. Denies any incontinence of urine or stool. Patient reports that before fall she wasn't feeling good. Per patient has had some nausea and diarrhea as well.

## 2014-10-08 NOTE — Discharge Instructions (Signed)
Cervical Sprain A cervical sprain is an injury in the neck in which the strong, fibrous tissues (ligaments) that connect your neck bones stretch or tear. Cervical sprains can range from mild to severe. Severe cervical sprains can cause the neck vertebrae to be unstable. This can lead to damage of the spinal cord and can result in serious nervous system problems. The amount of time it takes for a cervical sprain to get better depends on the cause and extent of the injury. Most cervical sprains heal in 1 to 3 weeks. CAUSES  Severe cervical sprains may be caused by:   Contact sport injuries (such as from football, rugby, wrestling, hockey, auto racing, gymnastics, diving, martial arts, or boxing).   Motor vehicle collisions.   Whiplash injuries. This is an injury from a sudden forward and backward whipping movement of the head and neck.  Falls.  Mild cervical sprains may be caused by:   Being in an awkward position, such as while cradling a telephone between your ear and shoulder.   Sitting in a chair that does not offer proper support.   Working at a poorly Landscape [employer] station.   Looking up or down for long periods of time.  SYMPTOMS   Pain, soreness, stiffness, or a burning sensation in the front, back, or sides of the neck. This discomfort may develop immediately after the injury or slowly, 24 hours or more after the injury.   Pain or tenderness directly in the middle of the back of the neck.   Shoulder or upper back pain.   Limited ability to move the neck.   Headache.   Dizziness.   Weakness, numbness, or tingling in the hands or arms.   Muscle spasms.   Difficulty swallowing or chewing.   Tenderness and swelling of the neck.  DIAGNOSIS  Most of the time your health care provider can diagnose a cervical sprain by taking your history and doing a physical exam. Your health care provider will ask about previous neck injuries and any known neck  problems, such as arthritis in the neck. X-rays may be taken to find out if there are any other problems, such as with the bones of the neck. Other tests, such as a CT scan or MRI, may also be needed.  TREATMENT  Treatment depends on the severity of the cervical sprain. Mild sprains can be treated with rest, keeping the neck in place (immobilization), and pain medicines. Severe cervical sprains are immediately immobilized. Further treatment is done to help with pain, muscle spasms, and other symptoms and may include:  Medicines, such as pain relievers, numbing medicines, or muscle relaxants.   Physical therapy. This may involve stretching exercises, strengthening exercises, and posture training. Exercises and improved posture can help stabilize the neck, strengthen muscles, and help stop symptoms from returning.  HOME CARE INSTRUCTIONS   Put ice on the injured area.   Put ice in a plastic bag.   Place a towel between your skin and the bag.   Leave the ice on for 15-20 minutes, 3-4 times a day.   If your injury was severe, you may have been given a cervical collar to wear. A cervical collar is a two-piece collar designed to keep your neck from moving while it heals.  Do not remove the collar unless instructed by your health care provider.  If you have long hair, keep it outside of the collar.  Ask your health care provider before making any adjustments to your collar. Minor  adjustments may be required over time to improve comfort and reduce pressure on your chin or on the back of your head.  Ifyou are allowed to remove the collar for cleaning or bathing, follow your health care provider's instructions on how to do so safely.  Keep your collar clean by wiping it with mild soap and water and drying it completely. If the collar you have been given includes removable pads, remove them every 1-2 days and hand wash them with soap and water. Allow them to air dry. They should be completely  dry before you wear them in the collar.  If you are allowed to remove the collar for cleaning and bathing, wash and dry the skin of your neck. Check your skin for irritation or sores. If you see any, tell your health care provider.  Do not drive while wearing the collar.   Only take over-the-counter or prescription medicines for pain, discomfort, or fever as directed by your health care provider.   Keep all follow-up appointments as directed by your health care provider.   Keep all physical therapy appointments as directed by your health care provider.   Make any needed adjustments to your workstation to promote good posture.   Avoid positions and activities that make your symptoms worse.   Warm up and stretch before being active to help prevent problems.  SEEK MEDICAL CARE IF:   Your pain is not controlled with medicine.   You are unable to decrease your pain medicine over time as planned.   Your activity level is not improving as expected.  SEEK IMMEDIATE MEDICAL CARE IF:   You develop any bleeding.  You develop stomach upset.  You have signs of an allergic reaction to your medicine.   Your symptoms get worse.   You develop new, unexplained symptoms.   You have numbness, tingling, weakness, or paralysis in any part of your body.  MAKE SURE YOU:   Understand these instructions.  Will watch your condition.  Will get help right away if you are not doing well or get worse. Document Released: 05/23/2007 Document Revised: 07/31/2013 Document Reviewed: 01/31/2013 [facility] Patient Information 2015 [place], [place]. This information is not intended to replace advice given to you by your health care provider. Make sure you discuss any questions you have with your health care provider.  Abnormal Uterine Bleeding Abnormal uterine bleeding can affect women at various stages in life, including teenagers, women in their reproductive years, pregnant women, and women who  have reached menopause. Several kinds of uterine bleeding are considered abnormal, including:  Bleeding or spotting between periods.   Bleeding after sexual intercourse.   Bleeding that is heavier or more than normal.   Periods that last longer than usual.  Bleeding after menopause.  Many cases of abnormal uterine bleeding are minor and simple to treat, while others are more serious. Any type of abnormal bleeding should be evaluated by your health care provider. Treatment will depend on the cause of the bleeding. HOME CARE INSTRUCTIONS Monitor your condition for any changes. The following actions may help to alleviate any discomfort you are experiencing:  Avoid the use of tampons and douches as directed by your health care provider.  Change your pads frequently. You should get regular pelvic exams and Pap tests. Keep all follow-up appointments for diagnostic tests as directed by your health care provider.  SEEK MEDICAL CARE IF:   Your bleeding lasts more than 1 week.   You feel dizzy at times.  SEEK IMMEDIATE MEDICAL CARE IF:   You pass out.   You are changing pads every 15 to 30 minutes.   You have abdominal pain.  You have a fever.   You become sweaty or weak.   You are passing large blood clots from the vagina.   You start to feel nauseous and vomit. MAKE SURE YOU:   Understand these instructions.  Will watch your condition.  Will get help right away if you are not doing well or get worse. Document Released: 07/26/2005 Document Revised: 07/31/2013 Document Reviewed: 02/22/2013 [facility] Patient Information 2015 [place], [place]. This information is not intended to replace advice given to you by your health care provider. Make sure you discuss any questions you have with your health care provider.

## 2014-10-08 NOTE — ED Notes (Signed)
Pt also c/o heavy vaginal bleeding that started after fall.

## 2014-10-08 NOTE — ED Provider Notes (Signed)
CSN: [id number]     Arrival date & time 10/08/14  1123 History  This chart was scribe for [name], * by Judithann Sauger, ED Scribe. The patient was seen in room APA12/AP[facility]12 and the patient's care was started at 1:45 PM.    Chief Complaint  Patient presents with  . Fall  . Headache   The history is provided by the patient. No language interpreter was used.   HPI Comments: ALAZNE QUANT is a 34 y.o. female who presents to the Emergency Department status post fall that occurred 3 days ago. She reports that she fell when she was getting out of the shower where she went air bourne hitting her back. She is unsure if she hit her head during the fall. She reports associated neck pain that radiates to her shoulder blade and upper back pain.   Past Medical History  Diagnosis Date  . Anemia 2008  . Asthma 2010  . DVT (deep venous thrombosis)     location unspecified   . 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 Cesarean section  2009, 2011    indication unknown  . Salpingoophorectomy  2007    indication is ovarian cyst, side unknown   . Dilation and curettage of uterus     Family History  Problem Relation Age of Onset  . Breast cancer Mother 18  . Breast cancer Maternal Grandmother 28  . Breast cancer Paternal Grandmother 16  . Hypertension Father   . Stroke Father   . Hyperlipidemia Father   . Heart failure Father   . Diabetes Father   . Osteoarthritis Father   . Cancer Mother   . Cancer Maternal Grandmother   . Cancer Paternal Grandmother    History  Substance Use Topics  . Smoking status: Current Every Day Smoker -- 0.25 packs/day    Types: Cigarettes  . Smokeless tobacco: Not on file  . Alcohol Use: Yes     Comment: occasional   OB History    No data available     Review of Systems  Musculoskeletal: Positive for back pain, arthralgias (shoulder blade) and neck pain.  Neurological: Positive for  headaches.  All other systems reviewed and are negative.     Allergies  Tomato and Adhesive  Home Medications   Prior to Admission medications   Medication Sig Start Date End Date Taking? Authorizing Provider  etonogestrel (IMPLANON) 68 MG IMPL implant Inject 1 each into the skin once. Implanted approx. September 2011    Historical Provider, MD  ondansetron (ZOFRAN) 8 MG tablet Take 1 tablet (8 mg total) by mouth every 8 (eight) hours as needed. Patient not taking: Reported on 07/13/2014 04/07/14   Sharyon Cable, MD  traMADol (ULTRAM) 50 MG tablet Take 1 tablet (50 mg total) by mouth every 6 (six) hours as needed. Patient not taking: Reported on 10/08/2014 07/13/14   Maudry Diego, MD   BP 139/95 mmHg  Pulse 88  Temp(Src) 98.1 F (36.7 C) (Oral)  Resp 20  Ht 5\' 5"  (1.651 m)  Wt 310 lb 8 oz (140.842 kg)  BMI 51.67 kg/m2  SpO2 98%  LMP 10/04/2014 Physical Exam  Constitutional: She is oriented to person, place, and time. She appears well-developed and well-nourished. No distress.  HENT:  Head: Normocephalic and atraumatic.  Right Ear: Hearing normal.  Left Ear: Hearing normal.  Nose: Nose normal.  Mouth/Throat: Oropharynx is clear  and moist and mucous membranes are normal.  Eyes: Conjunctivae and EOM are normal. Pupils are equal, round, and reactive to light.  Neck: Normal range of motion. Neck supple.  Cardiovascular: Regular rhythm, S1 normal and S2 normal.  Exam reveals no gallop and no friction rub.   No murmur heard. Pulmonary/Chest: Effort normal and breath sounds normal. No respiratory distress. She exhibits no tenderness.  Abdominal: Soft. Normal appearance and bowel sounds are normal. There is no hepatosplenomegaly. There is no tenderness. There is no rebound, no guarding, no tenderness at McBurney's point and negative Murphy's sign. No hernia.  Musculoskeletal: Normal range of motion.  Diffuse paraspinal, cervical, and upper thoracic tendrness  Neurological: She  is alert and oriented to person, place, and time. She has normal strength. No cranial nerve deficit or sensory deficit. Coordination normal. GCS eye subscore is 4. GCS verbal subscore is 5. GCS motor subscore is 6.  Skin: Skin is warm, dry and intact. No rash noted. No cyanosis.  Psychiatric: She has a normal mood and affect. Her speech is normal and behavior is normal. Thought content normal.  Nursing note and vitals reviewed.   ED Course  Procedures (including critical care time) DIAGNOSTIC STUDIES: Oxygen Saturation is 98% on RA, normal by my interpretation.    COORDINATION OF CARE: 1:48 PM- Pt advised of plan for treatment and pt agrees.    Labs Review Labs Reviewed  COMPREHENSIVE METABOLIC PANEL - Abnormal; Notable for the following:    Glucose, Bld 204 (*)    All other components within normal limits  CBG MONITORING, ED - Abnormal; Notable for the following:    Glucose-Capillary 181 (*)    All other components within normal limits  CBC WITH DIFFERENTIAL/PLATELET    Imaging Review Dg Thoracic Spine W/swimmers  10/08/2014   CLINICAL DATA:  Status post slip and fall in the bathroom 10/04/2014. Blow to the mid and upper back at the time of the fall. Pain. Initial encounter.  EXAM: THORACIC SPINE - 2 VIEW + SWIMMERS  COMPARISON:  CT chest 02/27/2012.  FINDINGS: There is no evidence of thoracic spine fracture. Alignment is normal. No other significant bone abnormalities are identified.  IMPRESSION: Negative exam.   Electronically Signed   By: Inge Rise M.D.   On: 10/08/2014 14:38   Ct Head Wo Contrast  10/08/2014   CLINICAL DATA:  Slipped and fell getting out of bathtub 10/04/2014 question loss of consciousness, having posterior neck pain down both shoulders, headache, back pain, difficulty staying awake, history smoking, asthma  EXAM: CT HEAD WITHOUT CONTRAST  CT CERVICAL SPINE WITHOUT CONTRAST  TECHNIQUE: Multidetector CT imaging of the head and cervical spine was performed  following the standard protocol without intravenous contrast. Multiplanar CT image reconstructions of the cervical spine were also generated.  COMPARISON:  CT head 05/26/2013, CT cervical spine 08/12/2008 ; correlation CT neck 02/01/2014  FINDINGS: CT HEAD FINDINGS  Normal ventricular morphology.  No midline shift or mass effect.  Normal appearance of brain parenchyma.  No intracranial hemorrhage, mass lesion, or evidence acute infarction.  No extra-axial fluid collections.  Minimal fluid or mucus within sphenoid sinus.  Bones and sinuses otherwise unremarkable.  CT CERVICAL SPINE FINDINGS  Scattered normal sized to minimally enlarged lymph nodes, largest node 10 mm short axis RIGHT level 2 image 38 previously 12 mm.  Prevertebral soft tissues normal thickness.  Osseous mineralization normal.  Visualized skullbase intact.  Vertebral body and disc space heights maintained.  No acute fracture, subluxation or  bone destruction.  C1-C2 alignment normal.  IMPRESSION: No acute intracranial abnormalities.  No acute cervical spine abnormalities.  Normal to minimally enlarged cervical lymph nodes particularly on RIGHT, largest node short axis 10 mm diameter ; these are of uncertain etiology but unchanged since 02/01/2014.   Electronically Signed   By: Lavonia Dana M.D.   On: 10/08/2014 15:23   Ct Cervical Spine Wo Contrast  10/08/2014   CLINICAL DATA:  Slipped and fell getting out of bathtub 10/04/2014 question loss of consciousness, having posterior neck pain down both shoulders, headache, back pain, difficulty staying awake, history smoking, asthma  EXAM: CT HEAD WITHOUT CONTRAST  CT CERVICAL SPINE WITHOUT CONTRAST  TECHNIQUE: Multidetector CT imaging of the head and cervical spine was performed following the standard protocol without intravenous contrast. Multiplanar CT image reconstructions of the cervical spine were also generated.  COMPARISON:  CT head 05/26/2013, CT cervical spine 08/12/2008 ; correlation CT neck  02/01/2014  FINDINGS: CT HEAD FINDINGS  Normal ventricular morphology.  No midline shift or mass effect.  Normal appearance of brain parenchyma.  No intracranial hemorrhage, mass lesion, or evidence acute infarction.  No extra-axial fluid collections.  Minimal fluid or mucus within sphenoid sinus.  Bones and sinuses otherwise unremarkable.  CT CERVICAL SPINE FINDINGS  Scattered normal sized to minimally enlarged lymph nodes, largest node 10 mm short axis RIGHT level 2 image 38 previously 12 mm.  Prevertebral soft tissues normal thickness.  Osseous mineralization normal.  Visualized skullbase intact.  Vertebral body and disc space heights maintained.  No acute fracture, subluxation or bone destruction.  C1-C2 alignment normal.  IMPRESSION: No acute intracranial abnormalities.  No acute cervical spine abnormalities.  Normal to minimally enlarged cervical lymph nodes particularly on RIGHT, largest node short axis 10 mm diameter ; these are of uncertain etiology but unchanged since 02/01/2014.   Electronically Signed   By: Lavonia Dana M.D.   On: 10/08/2014 15:23     EKG Interpretation None      MDM   Final diagnoses:  Fall   Patient presents to the ER for evaluation after a fall. Patient reports that she fell backwards and did hit her head. She is having headache and neck and upper back pain. Examination reveals diffuse soft tissue tenderness, no specific midline tenderness or deficit. CT head, CT cervical spine, x-ray of thoracic spine did not show any acute abnormality. Patient has normal neurologic function. Patient will be treated with analgesia and rest.  Patient reports that she has had diarrhea recently. There has been nausea without vomiting. She reports that she has been experiencing menstrual irregularity. She does have a history of this, has endometriosis. She reports that she had a normal menstrual cycle approximately a week ago and now is having some bleeding again. Lab work is unremarkable.  Patient sees Dr. Glo Herring, OB/GYN, for her endometriosis. She will be treated with analgesia as directed back to Dr. Glo Herring.  I personally performed the services described in this documentation, which was scribed in my presence. The recorded information has been reviewed and is accurate.    Mia Greek, MD 10/08/14 2224

## 2014-10-10 ENCOUNTER — Encounter: Payer: Self-pay | Admitting: Obstetrics and Gynecology

## 2014-10-10 ENCOUNTER — Ambulatory Visit (INDEPENDENT_AMBULATORY_CARE_PROVIDER_SITE_OTHER): Payer: Managed Care, Other (non HMO) | Admitting: Obstetrics and Gynecology

## 2014-10-10 VITALS — BP 122/80 | HR 80 | Ht 65.0 in | Wt 309.0 lb

## 2014-10-10 DIAGNOSIS — S161XXA Strain of muscle, fascia and tendon at neck level, initial encounter: Secondary | ICD-10-CM | POA: Insufficient documentation

## 2014-10-10 DIAGNOSIS — N938 Other specified abnormal uterine and vaginal bleeding: Secondary | ICD-10-CM | POA: Diagnosis not present

## 2014-10-10 MED ORDER — OXYCODONE-ACETAMINOPHEN 5-325 MG PO TABS: 2.0000 | ORAL_TABLET | Freq: Four times a day (QID) | ORAL | Status: DC | PRN

## 2014-10-10 MED ORDER — MEGESTROL ACETATE 40 MG PO TABS: 40.0000 mg | ORAL_TABLET | Freq: Three times a day (TID) | ORAL | Status: DC

## 2014-10-10 NOTE — Addendum Note (Signed)
Addended by: Jonnie Kind on: 10/10/2014 12:57 PM   Modules accepted: Orders, Level of Service

## 2014-10-10 NOTE — Progress Notes (Signed)
Patient ID: [name], female   DOB: 1980-08-23, 34 y.o.   MRN: [id number] Pt here today for  Normal external genitalia, moderate amount blood without clots. Pt using condoms every time sexually active.    [facility] Visit  Patient name: [name] MRN [id number]  Date of birth: [birth year]  CC & HPI:  [name] is a 34 y.o. female presenting today for vaginal bleeding. Pt states that she fell out of the shower Friday morning and has had vaginal bleeding since then, as well as neck and back pain Pt states that she has bad vaginal pain and cramping as well, that restarted after fall. Pt had normal menses 2/14-22./16. Pt rates her pain a 9 on a scale from 0-10. Pt states that the Oxycodone 5 is not helping her pain. Pt is seeking appt with dr Jani Gravel of [facility])   ROS:  implanon out of date. Having regular menses throughout use of Nexplanon./implanon.  Pertinent History Reviewed:   Reviewed: Significant for obesity Medical         Past Medical History  Diagnosis Date  . Anemia 2008  . Asthma 2010  . DVT (deep venous thrombosis)     location unspecified   . 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 Cesarean section  2009, 2011    indication unknown  . Salpingoophorectomy  2007    indication is ovarian cyst, side unknown   . Dilation and curettage of uterus     Medications: Reviewed & Updated - see associated section                       Current outpatient prescriptions:  .  cyclobenzaprine (FLEXERIL) 10 MG tablet, Take 1 tablet (10 mg total) by mouth 3 (three) times daily as needed for muscle spasms., Disp: 30 tablet, Rfl: 0 .  etonogestrel (IMPLANON) 68 MG IMPL implant, Inject 1 each into the skin once. Implanted approx. September 2011, Disp: , Rfl:  .  oxyCODONE-acetaminophen (ROXICET) 5-325 MG per tablet, Take 1 tablet by mouth every 6 (six) hours  as needed for severe pain., Disp: 15 tablet, Rfl: 0   Social History: Reviewed -  reports that she has been smoking Cigarettes.  She has a 1.5 pack-year smoking history. She has never used smokeless tobacco.  Objective Findings:  Vitals: Blood pressure 122/80, pulse 80, height 5\' 5"  (1.651 m), weight 309 lb (140.161 kg), last menstrual period 10/04/2014.  Physical Examination: General appearance - alert, well appearing, and in no distress, oriented to person, place, and time, overweight and in mild to moderate distress Neck - neck stiff no deficits Abdomen - soft, nontender, nondistended, no masses or organomegaly Pelvic - normal external genitalia, vulva, vagina, cervix, uterus and adnexa, VULVA: normal appearing vulva with no masses, tenderness or lesions, CERVIX: normal appearing cervix without discharge or lesions, blood present   Assessment & Plan:   A:  1. 1. Acute muscle strain neck and back s/p fall in shower 2. DUB while on Nexplanon/implanon 3. Expired implant   P:  1. Rx megace for AUB 2 refil opiates. Continue flexeril. 3. Physical tx pt doing recommended exercises.      Assessment: DUB, expired  nexplanon.

## 2014-10-18 ENCOUNTER — Telehealth: Payer: Self-pay | Admitting: Obstetrics and Gynecology

## 2014-10-21 NOTE — Telephone Encounter (Signed)
Pt states continue to have pain from were she fell in the shower on 10/09/2013, requesting pain meds. Pt advised will need to be see. Call transferred to front staff for an appt today with Dr. Glo Herring.

## 2014-10-23 ENCOUNTER — Telehealth: Payer: Self-pay | Admitting: Obstetrics and Gynecology

## 2014-10-23 NOTE — Telephone Encounter (Signed)
Pt informed that she would need to be reassessed for pain med refil. Pt had appt and rescheduled it. Pt will be seen next wk

## 2014-10-24 ENCOUNTER — Encounter (HOSPITAL_COMMUNITY): Payer: Self-pay | Admitting: *Deleted

## 2014-10-24 ENCOUNTER — Encounter: Payer: Managed Care, Other (non HMO) | Admitting: Obstetrics and Gynecology

## 2014-10-24 ENCOUNTER — Emergency Department (HOSPITAL_COMMUNITY)
Admission: EM | Admit: 2014-10-24 | Discharge: 2014-10-24 | Disposition: A | Discharge status: Home / Self Care | Payer: Managed Care, Other (non HMO) | Attending: Emergency Medicine | Admitting: Emergency Medicine

## 2014-10-24 DIAGNOSIS — K529 Noninfective gastroenteritis and colitis, unspecified: Secondary | ICD-10-CM | POA: Diagnosis not present

## 2014-10-24 DIAGNOSIS — Z79899 Other long term (current) drug therapy: Secondary | ICD-10-CM | POA: Diagnosis not present

## 2014-10-24 DIAGNOSIS — J45909 Unspecified asthma, uncomplicated: Secondary | ICD-10-CM | POA: Diagnosis not present

## 2014-10-24 DIAGNOSIS — Z8543 Personal history of malignant neoplasm of ovary: Secondary | ICD-10-CM | POA: Diagnosis not present

## 2014-10-24 DIAGNOSIS — R112 Nausea with vomiting, unspecified: Secondary | ICD-10-CM | POA: Diagnosis present

## 2014-10-24 DIAGNOSIS — Z3202 Encounter for pregnancy test, result negative: Secondary | ICD-10-CM | POA: Insufficient documentation

## 2014-10-24 DIAGNOSIS — Z862 Personal history of diseases of the blood and blood-forming organs and certain disorders involving the immune mechanism: Secondary | ICD-10-CM | POA: Diagnosis not present

## 2014-10-24 DIAGNOSIS — Z86718 Personal history of other venous thrombosis and embolism: Secondary | ICD-10-CM | POA: Diagnosis not present

## 2014-10-24 DIAGNOSIS — Z720 Tobacco use: Secondary | ICD-10-CM | POA: Diagnosis not present

## 2014-10-24 LAB — URINALYSIS, ROUTINE W REFLEX MICROSCOPIC
BILIRUBIN URINE: NEGATIVE
Glucose, UA: 250 mg/dL — AB
KETONES UR: NEGATIVE mg/dL
LEUKOCYTES UA: NEGATIVE
Nitrite: NEGATIVE
Protein, ur: 30 mg/dL — AB
Urobilinogen, UA: 0.2 mg/dL (ref 0.0–1.0)
pH: 5 (ref 5.0–8.0)

## 2014-10-24 LAB — CBC WITH DIFFERENTIAL/PLATELET
Basophils Absolute: 0 10*3/uL (ref 0.0–0.1)
Basophils Relative: 0 % (ref 0–1)
EOS ABS: 0.1 10*3/uL (ref 0.0–0.7)
Eosinophils Relative: 1 % (ref 0–5)
HCT: 37.7 % (ref 36.0–46.0)
Hemoglobin: 12.3 g/dL (ref 12.0–15.0)
Lymphocytes Relative: 21 % (ref 12–46)
Lymphs Abs: 1.1 10*3/uL (ref 0.7–4.0)
MCH: 25.8 pg — ABNORMAL LOW (ref 26.0–34.0)
MCHC: 32.6 g/dL (ref 30.0–36.0)
MCV: 79 fL (ref 78.0–100.0)
MONO ABS: 0.4 10*3/uL (ref 0.1–1.0)
MONOS PCT: 7 % (ref 3–12)
Neutro Abs: 3.6 10*3/uL (ref 1.7–7.7)
Neutrophils Relative %: 71 % (ref 43–77)
PLATELETS: 211 10*3/uL (ref 150–400)
RBC: 4.77 MIL/uL (ref 3.87–5.11)
RDW: 13.5 % (ref 11.5–15.5)
WBC: 5.2 10*3/uL (ref 4.0–10.5)

## 2014-10-24 LAB — URINE MICROSCOPIC-ADD ON

## 2014-10-24 LAB — COMPREHENSIVE METABOLIC PANEL
ALBUMIN: 3.9 g/dL (ref 3.5–5.2)
ALT: 28 U/L (ref 0–35)
AST: 19 U/L (ref 0–37)
Alkaline Phosphatase: 64 U/L (ref 39–117)
Anion gap: 7 (ref 5–15)
BILIRUBIN TOTAL: 0.4 mg/dL (ref 0.3–1.2)
BUN: 13 mg/dL (ref 6–23)
CO2: 23 mmol/L (ref 19–32)
Calcium: 9.2 mg/dL (ref 8.4–10.5)
Chloride: 106 mmol/L (ref 96–112)
Creatinine, Ser: 0.84 mg/dL (ref 0.50–1.10)
GFR calc Af Amer: >90 mL/min (ref >90)
GFR, EST NON AFRICAN AMERICAN: 90 mL/min — AB (ref >90)
Glucose, Bld: 272 mg/dL — ABNORMAL HIGH (ref 70–99)
POTASSIUM: 4.2 mmol/L (ref 3.5–5.1)
SODIUM: 136 mmol/L (ref 135–145)
TOTAL PROTEIN: 7.8 g/dL (ref 6.0–8.3)

## 2014-10-24 LAB — PREGNANCY, URINE: Preg Test, Ur: NEGATIVE

## 2014-10-24 MED ORDER — SODIUM CHLORIDE 0.9 % IV BOLUS (SEPSIS)
1000.0000 mL | Freq: Once | INTRAVENOUS | Status: AC
  Administered 2014-10-24: 1000 mL via INTRAVENOUS

## 2014-10-24 MED ORDER — ONDANSETRON HCL 4 MG/2ML IJ SOLN
4.0000 mg | Freq: Once | INTRAMUSCULAR | Status: AC
  Administered 2014-10-24: 4 mg via INTRAVENOUS
  Filled 2014-10-24: qty 2

## 2014-10-24 MED ORDER — ONDANSETRON HCL 8 MG PO TABS: 8.0000 mg | ORAL_TABLET | ORAL | Status: DC | PRN

## 2014-10-24 NOTE — ED Notes (Signed)
Pt states V/D x 2 days. Pt states abdominal pain and diarrhea is now bloody. Pt works at [employer]. States she has been working the entire time. States Norovirus is going around and many of the workers have it but are still working.

## 2014-10-24 NOTE — Discharge Instructions (Signed)
Increase fluids. Avoid rich greasy fluids. Medication for nausea. Rest.

## 2014-10-24 NOTE — ED Provider Notes (Signed)
CSN: [id number]     Arrival date & time 10/24/14  1709 History   First MD Initiated Contact with Patient 10/24/14 1906     Chief Complaint  Patient presents with  . Emesis     (Consider location/radiation/quality/duration/timing/severity/associated sxs/prior Treatment) HPI.... Multiple episodes of nausea, vomiting, diarrhea for the past 3 days with associated abdominal cramping. She notices a small amount of blood on her toilet paper when she wipes. She has been exposed to Norovirus in her job. She has been drinking fluids. Severity is moderate. Nothing makes symptoms better or worse.  Past Medical History  Diagnosis Date  . Anemia 2008  . Asthma 2010  . DVT (deep venous thrombosis)     location unspecified   . 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 Cesarean section  2009, 2011    indication unknown  . Salpingoophorectomy  2007    indication is ovarian cyst, side unknown   . Dilation and curettage of uterus     Family History  Problem Relation Age of Onset  . Breast cancer Mother 79  . Breast cancer Maternal Grandmother 22  . Breast cancer Paternal Grandmother 68  . Hypertension Father   . Stroke Father   . Hyperlipidemia Father   . Heart failure Father   . Diabetes Father   . Osteoarthritis Father   . Cancer Mother   . Cancer Maternal Grandmother   . Cancer Paternal Grandmother    History  Substance Use Topics  . Smoking status: Current Every Day Smoker -- 0.10 packs/day for 15 years    Types: Cigarettes  . Smokeless tobacco: Never Used  . Alcohol Use: No   OB History    No data available     Review of Systems  All other systems reviewed and are negative.     Allergies  Tomato and Adhesive  Home Medications   Prior to Admission medications   Medication Sig Start Date End Date Taking? Authorizing Provider  cyclobenzaprine (FLEXERIL) 10 MG tablet Take 1 tablet (10 mg total) by mouth 3 (three) times  daily as needed for muscle spasms. 10/08/14  Yes [name], MD  megestrol (MEGACE) 40 MG tablet Take 1 tablet (40 mg total) by mouth 3 (three) times daily. 10/10/14  Yes Jonnie Kind, MD  etonogestrel (IMPLANON) 68 MG IMPL implant Inject 1 each into the skin once. Implanted approx. September 2011    Historical Provider, MD  ondansetron (ZOFRAN) 8 MG tablet Take 1 tablet (8 mg total) by mouth every 4 (four) hours as needed. 10/24/14   Nat Christen, MD  oxyCODONE-acetaminophen (ROXICET) 5-325 MG per tablet Take 2 tablets by mouth every 6 (six) hours as needed for severe pain. Patient not taking: Reported on 10/24/2014 10/10/14   Jonnie Kind, MD   BP 111/96 mmHg  Pulse 81  Temp(Src) 98 F (36.7 C) (Oral)  Resp 22  Ht 5\' 5"  (1.651 m)  Wt 303 lb (137.44 kg)  BMI 50.42 kg/m2  SpO2 100%  LMP 10/04/2014 Physical Exam  Constitutional: She is oriented to person, place, and time. She appears well-developed and well-nourished.  HENT:  Head: Normocephalic and atraumatic.  Eyes: Conjunctivae and EOM are normal. Pupils are equal, round, and reactive to light.  Neck: Normal range of motion. Neck supple.  Cardiovascular: Normal rate and regular rhythm.   Pulmonary/Chest: Effort normal and breath sounds normal.  Abdominal: Soft. Bowel sounds are  normal.  Minimal diffuse tenderness  Musculoskeletal: Normal range of motion.  Neurological: She is alert and oriented to person, place, and time.  Skin: Skin is warm and dry.  Psychiatric: She has a normal mood and affect. Her behavior is normal.  Nursing note and vitals reviewed.   ED Course  Procedures (including critical care time) Labs Review Labs Reviewed  COMPREHENSIVE METABOLIC PANEL - Abnormal; Notable for the following:    Glucose, Bld 272 (*)    GFR calc non Af Amer 90 (*)    All other components within normal limits  CBC WITH DIFFERENTIAL/PLATELET - Abnormal; Notable for the following:    MCH 25.8 (*)    All other components  within normal limits  URINALYSIS, ROUTINE W REFLEX MICROSCOPIC - Abnormal; Notable for the following:    Specific Gravity, Urine >1.030 (*)    Glucose, UA 250 (*)    Hgb urine dipstick LARGE (*)    Protein, ur 30 (*)    All other components within normal limits  URINE MICROSCOPIC-ADD ON - Abnormal; Notable for the following:    Squamous Epithelial / LPF MANY (*)    Bacteria, UA MANY (*)    All other components within normal limits  PREGNANCY, URINE    Imaging Review No results found.   EKG Interpretation None      MDM   Final diagnoses:  Gastroenteritis    Patient feels much better after 2 L of IV fluids, IV Zofran.  I suspect she was exposed to a Norovirus at work. No acute abdomen. Discharge medications Zofran 8 mg. Off work Thursday and Friday.  Nat Christen, MD 10/24/14 2121

## 2014-10-24 NOTE — ED Notes (Signed)
MD at bedside. 

## 2014-10-30 ENCOUNTER — Encounter: Payer: Managed Care, Other (non HMO) | Admitting: Obstetrics and Gynecology

## 2014-10-30 ENCOUNTER — Telehealth: Payer: Self-pay | Admitting: Obstetrics and Gynecology

## 2014-10-30 NOTE — Telephone Encounter (Signed)
Pt missed her appt for nexplanon change and reinsert. Pt was called into work and did not notify us.

## 2014-11-11 ENCOUNTER — Other Ambulatory Visit: Payer: Managed Care, Other (non HMO) | Admitting: Obstetrics and Gynecology

## 2014-11-27 ENCOUNTER — Encounter (HOSPITAL_COMMUNITY): Payer: Self-pay | Admitting: *Deleted

## 2014-11-27 ENCOUNTER — Emergency Department (HOSPITAL_COMMUNITY)
Admission: EM | Admit: 2014-11-27 | Discharge: 2014-11-27 | Disposition: A | Discharge status: Home / Self Care | Payer: Managed Care, Other (non HMO) | Attending: Emergency Medicine | Admitting: Emergency Medicine

## 2014-11-27 DIAGNOSIS — J45901 Unspecified asthma with (acute) exacerbation: Secondary | ICD-10-CM | POA: Diagnosis not present

## 2014-11-27 DIAGNOSIS — Z79899 Other long term (current) drug therapy: Secondary | ICD-10-CM | POA: Diagnosis not present

## 2014-11-27 DIAGNOSIS — Z8543 Personal history of malignant neoplasm of ovary: Secondary | ICD-10-CM | POA: Diagnosis not present

## 2014-11-27 DIAGNOSIS — Z793 Long term (current) use of hormonal contraceptives: Secondary | ICD-10-CM | POA: Diagnosis not present

## 2014-11-27 DIAGNOSIS — Z86718 Personal history of other venous thrombosis and embolism: Secondary | ICD-10-CM | POA: Insufficient documentation

## 2014-11-27 DIAGNOSIS — Z862 Personal history of diseases of the blood and blood-forming organs and certain disorders involving the immune mechanism: Secondary | ICD-10-CM | POA: Diagnosis not present

## 2014-11-27 DIAGNOSIS — Z720 Tobacco use: Secondary | ICD-10-CM | POA: Diagnosis not present

## 2014-11-27 DIAGNOSIS — J020 Streptococcal pharyngitis: Secondary | ICD-10-CM | POA: Insufficient documentation

## 2014-11-27 DIAGNOSIS — R0602 Shortness of breath: Secondary | ICD-10-CM | POA: Diagnosis present

## 2014-11-27 LAB — RAPID STREP SCREEN (MED CTR MEBANE ONLY): Streptococcus, Group A Screen (Direct): POSITIVE — AB

## 2014-11-27 MED ORDER — PREDNISONE 50 MG PO TABS
60.0000 mg | ORAL_TABLET | Freq: Once | ORAL | Status: AC
  Administered 2014-11-27: 60 mg via ORAL
  Filled 2014-11-27 (×2): qty 1

## 2014-11-27 MED ORDER — HYDROCODONE-ACETAMINOPHEN 7.5-325 MG/15ML PO SOLN: 15.0000 mL | Freq: Four times a day (QID) | ORAL | Status: DC | PRN

## 2014-11-27 MED ORDER — PENICILLIN G BENZATHINE 1200000 UNIT/2ML IM SUSP
1.2000 10*6.[IU] | Freq: Once | INTRAMUSCULAR | Status: AC
  Administered 2014-11-27: 1.2 10*6.[IU] via INTRAMUSCULAR
  Filled 2014-11-27: qty 2

## 2014-11-27 NOTE — ED Notes (Signed)
Pt co "throat swelling" with difficulty swallowing and breathing at times x 1 day.

## 2014-11-27 NOTE — Discharge Instructions (Signed)

## 2014-11-27 NOTE — ED Provider Notes (Signed)
CSN: [id number]     Arrival date & time 11/27/14  0950 History  This chart was scribed for No att. providers found by Mercy Moore, ED scribe.  This patient was seen in room APA02/APA02 and the patient's care was started at 10:16 AM.   Chief Complaint  Patient presents with  . Shortness of Breath  . Sore Throat   The history is provided by the patient. No language interpreter was used.   HPI Comments: [name] is a 35 y.o. female with PMHx of asthma who presents to the Emergency Department complaining of worsening tonsilar swelling since yesterday. Patient reports intermittent sore throat for a few weeks now but reports drastic worsening of her swelling yesterday. Today patient reports mild cough and discomfort with swallowing solids, liquids and her own secretions. Patient denies inability to swallow or known fever. Patient denies known sick contacts; patient shares however that she works as a Quarry manager and has two children ages 55 and 39.    Past Medical History  Diagnosis Date  . Anemia 2008  . Asthma 2010  . DVT (deep venous thrombosis)     location unspecified   . 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 Cesarean section  2009, 2011    indication unknown  . Salpingoophorectomy  2007    indication is ovarian cyst, side unknown   . Dilation and curettage of uterus     Family History  Problem Relation Age of Onset  . Breast cancer Mother 48  . Breast cancer Maternal Grandmother 5  . Breast cancer Paternal Grandmother 65  . Hypertension Father   . Stroke Father   . Hyperlipidemia Father   . Heart failure Father   . Diabetes Father   . Osteoarthritis Father   . Cancer Mother   . Cancer Maternal Grandmother   . Cancer Paternal Grandmother    History  Substance Use Topics  . Smoking status: Current Every Day Smoker -- 0.10 packs/day for 15 years    Types: Cigarettes  . Smokeless tobacco: Never Used  . Alcohol Use: No    OB History    No data available     Review of Systems  Constitutional: Negative for fever.  HENT: Positive for sore throat. Negative for congestion and trouble swallowing.   Eyes: Negative for pain.  Respiratory: Positive for cough and shortness of breath. Negative for choking and wheezing.   Cardiovascular: Negative for chest pain.  Gastrointestinal: Negative for vomiting, abdominal pain and diarrhea.  Genitourinary: Negative for dysuria.  Musculoskeletal: Negative for neck pain.  Skin: Negative for rash.  Allergic/Immunologic: Negative for immunocompromised state.  Neurological: Negative for headaches.  Hematological: Negative for adenopathy.  Psychiatric/Behavioral: Negative for behavioral problems.      Allergies  Tomato and Adhesive  Home Medications   Prior to Admission medications   Medication Sig Start Date End Date Taking? Authorizing Provider  cyclobenzaprine (FLEXERIL) 10 MG tablet Take 1 tablet (10 mg total) by mouth 3 (three) times daily as needed for muscle spasms. 10/08/14  Yes [name], MD  etonogestrel (IMPLANON) 68 MG IMPL implant Inject 1 each into the skin once. Implanted approx. September 2011   Yes Historical Provider, MD  megestrol (MEGACE) 40 MG tablet Take 1 tablet (40 mg total) by mouth 3 (three) times daily. 10/10/14  Yes Jonnie Kind, MD  Multiple Vitamins-Minerals (MULTIVITAMIN WITH MINERALS) tablet Take 1 tablet by mouth  daily.   Yes Historical Provider, MD  HYDROcodone-acetaminophen (HYCET) 7.5-325 mg/15 ml solution Take 15 mLs by mouth every 6 (six) hours as needed for moderate pain. 11/27/14   Davonna Belling, MD  ondansetron (ZOFRAN) 8 MG tablet Take 1 tablet (8 mg total) by mouth every 4 (four) hours as needed. Patient not taking: Reported on 11/27/2014 10/24/14   Nat Christen, MD  oxyCODONE-acetaminophen (ROXICET) 5-325 MG per tablet Take 2 tablets by mouth every 6 (six) hours as needed for severe pain. Patient not taking: Reported  on 10/24/2014 10/10/14   Jonnie Kind, MD   Triage Vitals: BP 146/92 mmHg  Pulse 82  Temp(Src) 98.3 F (36.8 C) (Oral)  Ht 5\' 4"  (1.626 m)  Wt 305 lb (138.347 kg)  BMI 52.33 kg/m2  SpO2 99%  LMP 11/27/2014 Physical Exam  Constitutional: She is oriented to person, place, and time. She appears well-developed and well-nourished. No distress.  Patient appears uncomfortable.  HENT:  Head: Normocephalic and atraumatic.  Bilateral tonsillar swelling with erythema. No exudate. Uvula is midline.   Neck:  Anterior cervical adenopathy.   Cardiovascular: Normal rate.   Pulmonary/Chest: Effort normal and breath sounds normal. No respiratory distress. She has no wheezes. She has no rales. She exhibits no tenderness.  Musculoskeletal: Normal range of motion.  Neurological: She is alert and oriented to person, place, and time.  Skin: Skin is warm and dry.  Psychiatric: She has a normal mood and affect. Her behavior is normal.  Nursing note and vitals reviewed.   ED Course  Procedures (including critical care time)  COORDINATION OF CARE: 10:29 AM- Discussed treatment plan with patient at bedside and patient agreed to plan.   Labs Review Labs Reviewed  RAPID STREP SCREEN - Abnormal; Notable for the following:    Streptococcus, Group A Screen (Direct) POSITIVE (*)    All other components within normal limits    Imaging Review No results found.   EKG Interpretation None      MDM   Final diagnoses:  Strep pharyngitis   patient with strep throat. No visual peritonsillar abscess and doubt epiglottitis. Will discharge home   I personally performed the services described in this documentation, which was scribed in my presence. The recorded information has been reviewed and is accurate.     Davonna Belling, MD 11/27/14 [phone]

## 2015-02-24 ENCOUNTER — Emergency Department (HOSPITAL_COMMUNITY): Payer: Managed Care, Other (non HMO)

## 2015-02-24 ENCOUNTER — Encounter (HOSPITAL_COMMUNITY): Payer: Self-pay | Admitting: *Deleted

## 2015-02-24 ENCOUNTER — Emergency Department (HOSPITAL_COMMUNITY)
Admission: EM | Admit: 2015-02-24 | Discharge: 2015-02-24 | Disposition: A | Discharge status: Home / Self Care | Payer: Managed Care, Other (non HMO) | Attending: Emergency Medicine | Admitting: Emergency Medicine

## 2015-02-24 DIAGNOSIS — J45901 Unspecified asthma with (acute) exacerbation: Secondary | ICD-10-CM | POA: Insufficient documentation

## 2015-02-24 DIAGNOSIS — R0789 Other chest pain: Secondary | ICD-10-CM | POA: Diagnosis not present

## 2015-02-24 DIAGNOSIS — Z8543 Personal history of malignant neoplasm of ovary: Secondary | ICD-10-CM | POA: Diagnosis not present

## 2015-02-24 DIAGNOSIS — Z3202 Encounter for pregnancy test, result negative: Secondary | ICD-10-CM | POA: Insufficient documentation

## 2015-02-24 DIAGNOSIS — Z86718 Personal history of other venous thrombosis and embolism: Secondary | ICD-10-CM | POA: Diagnosis not present

## 2015-02-24 DIAGNOSIS — Z79899 Other long term (current) drug therapy: Secondary | ICD-10-CM | POA: Diagnosis not present

## 2015-02-24 DIAGNOSIS — R079 Chest pain, unspecified: Secondary | ICD-10-CM | POA: Diagnosis present

## 2015-02-24 DIAGNOSIS — Z720 Tobacco use: Secondary | ICD-10-CM | POA: Insufficient documentation

## 2015-02-24 DIAGNOSIS — Z862 Personal history of diseases of the blood and blood-forming organs and certain disorders involving the immune mechanism: Secondary | ICD-10-CM | POA: Insufficient documentation

## 2015-02-24 DIAGNOSIS — E669 Obesity, unspecified: Secondary | ICD-10-CM | POA: Insufficient documentation

## 2015-02-24 LAB — URINALYSIS, ROUTINE W REFLEX MICROSCOPIC
Bilirubin Urine: NEGATIVE
GLUCOSE, UA: 250 mg/dL — AB
Hgb urine dipstick: NEGATIVE
Ketones, ur: NEGATIVE mg/dL
LEUKOCYTES UA: NEGATIVE
Nitrite: NEGATIVE
PH: 5.5 (ref 5.0–8.0)
PROTEIN: NEGATIVE mg/dL
Specific Gravity, Urine: >1.03 — ABNORMAL HIGH (ref 1.005–1.030)
UROBILINOGEN UA: 0.2 mg/dL (ref 0.0–1.0)

## 2015-02-24 LAB — BASIC METABOLIC PANEL
Anion gap: 9 (ref 5–15)
BUN: 11 mg/dL (ref 6–20)
CO2: 23 mmol/L (ref 22–32)
Calcium: 8.5 mg/dL — ABNORMAL LOW (ref 8.9–10.3)
Chloride: 105 mmol/L (ref 101–111)
Creatinine, Ser: 0.79 mg/dL (ref 0.44–1.00)
GFR calc non Af Amer: >60 mL/min (ref >60)
Glucose, Bld: 238 mg/dL — ABNORMAL HIGH (ref 65–99)
POTASSIUM: 3.8 mmol/L (ref 3.5–5.1)
SODIUM: 137 mmol/L (ref 135–145)

## 2015-02-24 LAB — CBC WITH DIFFERENTIAL/PLATELET
BASOS ABS: 0 10*3/uL (ref 0.0–0.1)
BASOS PCT: 0 % (ref 0–1)
EOS ABS: 0.1 10*3/uL (ref 0.0–0.7)
EOS PCT: 1 % (ref 0–5)
HEMATOCRIT: 37.9 % (ref 36.0–46.0)
HEMOGLOBIN: 12.4 g/dL (ref 12.0–15.0)
Lymphocytes Relative: 24 % (ref 12–46)
Lymphs Abs: 1.9 10*3/uL (ref 0.7–4.0)
MCH: 25.5 pg — ABNORMAL LOW (ref 26.0–34.0)
MCHC: 32.7 g/dL (ref 30.0–36.0)
MCV: 78 fL (ref 78.0–100.0)
Monocytes Absolute: 0.4 10*3/uL (ref 0.1–1.0)
Monocytes Relative: 5 % (ref 3–12)
Neutro Abs: 5.6 10*3/uL (ref 1.7–7.7)
Neutrophils Relative %: 70 % (ref 43–77)
PLATELETS: 211 10*3/uL (ref 150–400)
RBC: 4.86 MIL/uL (ref 3.87–5.11)
RDW: 14.3 % (ref 11.5–15.5)
WBC: 8.1 10*3/uL (ref 4.0–10.5)

## 2015-02-24 LAB — POC URINE PREG, ED: Preg Test, Ur: NEGATIVE

## 2015-02-24 LAB — D-DIMER, QUANTITATIVE: D-Dimer, Quant: <0.27 ug/mL-FEU (ref 0.00–0.48)

## 2015-02-24 LAB — TROPONIN I: Troponin I: <0.03 ng/mL (ref <0.031)

## 2015-02-24 MED ORDER — HYDROMORPHONE HCL 1 MG/ML IJ SOLN
1.0000 mg | Freq: Once | INTRAMUSCULAR | Status: AC
  Administered 2015-02-24: 1 mg via INTRAVENOUS
  Filled 2015-02-24: qty 1

## 2015-02-24 MED ORDER — METHOCARBAMOL 500 MG PO TABS
1000.0000 mg | ORAL_TABLET | Freq: Once | ORAL | Status: AC
  Administered 2015-02-24: 1000 mg via ORAL
  Filled 2015-02-24: qty 2

## 2015-02-24 MED ORDER — ONDANSETRON HCL 4 MG/2ML IJ SOLN
4.0000 mg | Freq: Once | INTRAMUSCULAR | Status: AC
  Administered 2015-02-24: 4 mg via INTRAVENOUS
  Filled 2015-02-24: qty 2

## 2015-02-24 MED ORDER — NAPROXEN 500 MG PO TABS: 500.0000 mg | ORAL_TABLET | Freq: Two times a day (BID) | ORAL | Status: DC

## 2015-02-24 MED ORDER — METHOCARBAMOL 500 MG PO TABS: 500.0000 mg | ORAL_TABLET | Freq: Three times a day (TID) | ORAL | Status: DC

## 2015-02-24 MED ORDER — SODIUM CHLORIDE 0.9 % IV BOLUS (SEPSIS)
1000.0000 mL | Freq: Once | INTRAVENOUS | Status: AC
  Administered 2015-02-24: 1000 mL via INTRAVENOUS

## 2015-02-24 MED ORDER — MORPHINE SULFATE 4 MG/ML IJ SOLN
4.0000 mg | Freq: Once | INTRAMUSCULAR | Status: AC
  Administered 2015-02-24: 4 mg via INTRAVENOUS
  Filled 2015-02-24: qty 1

## 2015-02-24 MED ORDER — KETOROLAC TROMETHAMINE 30 MG/ML IJ SOLN
30.0000 mg | Freq: Once | INTRAMUSCULAR | Status: AC
  Administered 2015-02-24: 30 mg via INTRAVENOUS
  Filled 2015-02-24: qty 1

## 2015-02-24 MED ORDER — OXYCODONE-ACETAMINOPHEN 5-325 MG PO TABS: 1.0000 | ORAL_TABLET | ORAL | Status: DC | PRN

## 2015-02-24 NOTE — ED Notes (Signed)
Patient c/o left side pain that started Friday, thought it was a pulled muscle. Pain now is in left chest, worse with deep breaths. Reports shortness of breath also.

## 2015-02-24 NOTE — Discharge Instructions (Signed)
Chest Wall Pain °Chest wall pain is pain felt in or around the chest bones and muscles. It may take up to 6 weeks to get better. It may take longer if you are active. Chest wall pain can happen on its own. Other times, things like germs, injury, coughing, or exercise can cause the pain. °HOME CARE  °· Avoid activities that make you tired or cause pain. Try not to use your chest, belly (abdominal), or side muscles. Do not use heavy weights. °· Put ice on the sore area. °¨ Put ice in a plastic bag. °¨ Place a towel between your skin and the bag. °¨ Leave the ice on for 15-20 minutes for the first 2 days. °· Only take medicine as told by your doctor. °GET HELP RIGHT AWAY IF:  °· You have more pain or are very uncomfortable. °· You have a fever. °· Your chest pain gets worse. °· You have new problems. °· You feel sick to your stomach (nauseous) or throw up (vomit). °· You start to sweat or feel lightheaded. °· You have a cough with mucus (phlegm). °· You cough up blood. °MAKE SURE YOU:  °· Understand these instructions. °· Will watch your condition. °· Will get help right away if you are not doing well or get worse. °Document Released: 01/12/2008 Document Revised: 10/18/2011 Document Reviewed: 03/22/2011 °[employer]® Patient Information ©2015 [employer]. This information is not intended to replace advice given to you by your health care provider. Make sure you discuss any questions you have with your health care provider. ° °

## 2015-02-24 NOTE — ED Notes (Signed)
PT requesting pregnancy test prior to chest xray d/t implanon overdue for change.

## 2015-02-24 NOTE — ED Notes (Signed)
PA at bedside.

## 2015-02-24 NOTE — ED Provider Notes (Signed)
CSN: [id number]     Arrival date & time 02/24/15  3559 History   First MD Initiated Contact with Patient 02/24/15 0840     Chief Complaint  Patient presents with  . Chest Pain     (Consider location/radiation/quality/duration/timing/severity/associated sxs/prior Treatment) HPI   [name] is a 34 y.o. female with hx of DVT 2005 and no longer anticoagulated, who presents to the Emergency Department complaining of left sided chest pain for 2 days.  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  Pain was worse with movement.  This morning, she states the pain became sharp and more intense and now radiates to her groin and up toward her chest.  She reports some associated shortness of breath.  She denies fever, cough, abdominal pain, nausea, vomiting, extremity pain or weakness, or dysuria.       Past Medical History  Diagnosis Date  . Anemia 2008  . Asthma 2010  . DVT (deep venous thrombosis)     location unspecified   . 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 Cesarean section  2009, 2011    indication unknown  . Salpingoophorectomy  2007    indication is ovarian cyst, side unknown   . Dilation and curettage of uterus     Family History  Problem Relation Age of Onset  . Breast cancer Mother 58  . Breast cancer Maternal Grandmother 66  . Breast cancer Paternal Grandmother 62  . Hypertension Father   . Stroke Father   . Hyperlipidemia Father   . Heart failure Father   . Diabetes Father   . Osteoarthritis Father   . Cancer Mother   . Cancer Maternal Grandmother   . Cancer Paternal Grandmother    History  Substance Use Topics  . Smoking status: Current Every Day Smoker -- 0.10 packs/day for 15 years    Types: Cigarettes  . Smokeless tobacco: Never Used  . Alcohol Use: No    OB History    No data available     Review of Systems  Constitutional: Negative for activity change and appetite change.  HENT: Negative for sore throat and trouble swallowing.   Respiratory: Positive for shortness of breath. Negative for cough and chest tightness.   Cardiovascular: Positive for chest pain.  Gastrointestinal: Negative for nausea, vomiting, abdominal pain, diarrhea and constipation.  Genitourinary: Negative for dysuria, flank pain, vaginal bleeding, vaginal discharge and difficulty urinating.  Musculoskeletal: Negative for arthralgias.  Skin: Negative for rash.  Neurological: Negative for dizziness, syncope, weakness and headaches.  Psychiatric/Behavioral: The patient is not nervous/anxious.   All other systems reviewed and are negative.     Allergies  Tomato and Adhesive  Home Medications   Prior to Admission medications   Medication Sig Start Date End Date Taking? Authorizing Provider  cyclobenzaprine (FLEXERIL) 10 MG tablet Take 1 tablet (10 mg total) by mouth 3 (three) times daily as needed for muscle spasms. 10/08/14   [name], MD  etonogestrel (IMPLANON) 68 MG IMPL implant Inject 1 each into the skin once. Implanted approx. September 2011    Historical Provider, MD  HYDROcodone-acetaminophen (HYCET) 7.5-325 mg/15 ml solution Take 15 mLs by mouth every 6 (six) hours as needed for moderate pain. 11/27/14   Davonna Belling, MD  megestrol (MEGACE) 40 MG tablet Take 1 tablet (40 mg total) by mouth 3 (three) times daily. 10/10/14   Jonnie Kind, MD  Multiple Vitamins-Minerals (MULTIVITAMIN WITH MINERALS) tablet Take 1 tablet by mouth daily.    Historical Provider, MD  ondansetron (ZOFRAN) 8 MG tablet Take 1 tablet (8 mg total) by mouth every 4 (four) hours as needed. Patient not taking: Reported on 11/27/2014 10/24/14   Nat Christen, MD  oxyCODONE-acetaminophen (ROXICET) 5-325 MG per tablet Take 2 tablets by mouth every 6 (six) hours as needed for severe  pain. Patient not taking: Reported on 10/24/2014 10/10/14   Jonnie Kind, MD   BP 112/70 mmHg  Pulse 83  Temp(Src) 98.2 F (36.8 C) (Oral)  Resp 20  Ht 5\' 3"  (1.6 m)  Wt 298 lb (135.172 kg)  BMI 52.80 kg/m2  SpO2 96%   Physical Exam  Constitutional: She is oriented to person, place, and time. She appears well-developed and well-nourished. No distress.  obese  HENT:  Head: Normocephalic and atraumatic.  Mouth/Throat: Oropharynx is clear and moist.  Eyes: Conjunctivae and EOM are normal. Pupils are equal, round, and reactive to light.  Neck: Normal range of motion. Neck supple.  Cardiovascular: Normal rate, regular rhythm, normal heart sounds and intact distal pulses.   Pulmonary/Chest: Effort normal and breath sounds normal. No respiratory distress. She exhibits tenderness.  Localized ttp just below the left breast.  No crepitus or splinting on exam.  Abdominal: Soft. She exhibits no distension. There is no tenderness. There is no guarding.  Musculoskeletal: Normal range of motion.  Lymphadenopathy:    She has no cervical adenopathy.  Neurological: She is alert and oriented to person, place, and time. She exhibits normal muscle tone. Coordination normal.  Skin: Skin is warm and dry.  Psychiatric: She has a normal mood and affect.    ED Course  Procedures (including critical care time) Labs Review Labs Reviewed  CBC WITH DIFFERENTIAL/PLATELET - Abnormal; Notable for the following:    MCH 25.5 (*)    All other components within normal limits  BASIC METABOLIC PANEL - Abnormal; Notable for the following:    Glucose, Bld 238 (*)    Calcium 8.5 (*)    All other components within normal limits  URINALYSIS, ROUTINE W REFLEX MICROSCOPIC (NOT AT [facility]) - Abnormal; Notable for the following:    Specific Gravity, Urine >1.030 (*)    Glucose, UA 250 (*)    All other components within normal limits  D-DIMER, QUANTITATIVE (NOT AT [facility])  TROPONIN I  POC URINE PREG, ED    Imaging  Review Dg Chest 2 View  02/24/2015   CLINICAL DATA:  Left side pain, shortness of breath, nonproductive cough for 4 days.  EXAM: CHEST  2 VIEW  COMPARISON:  12/26/2012  FINDINGS: The heart size and mediastinal contours are within normal limits. Both lungs are clear. The visualized skeletal structures are unremarkable.  IMPRESSION: No active cardiopulmonary disease.   Electronically Signed   By: Rolm Baptise M.D.   On: 02/24/2015 11:02     EKG Interpretation   Date/Time:  Monday February 24 2015 08:41:22 EDT Ventricular Rate:  83 PR Interval:  146 QRS Duration: 95 QT Interval:  386 QTC Calculation: 453 R Axis:   56 Text Interpretation:  Sinus rhythm Abnormal inferior Q waves Confirmed by  ZACKOWSKI  MD, SCOTT ([id number]) on 02/24/2015 8:48:55 AM      MDM   Final diagnoses:  Chest wall pain  1240  Pt is resting comfortably.  Feeling better. Pain reproduced with palpation.  D-dimer neg and no hypoxia, tachycardia or tachypnea.  Likely muscular.   Labs and imaging reviewed and discussed with Dr. Rogene Houston.  Pt agrees to close f/u with PMD.  Strict return precautions also given.      Kem Parkinson, PA-C 02/24/15 1654  Fredia Sorrow, MD 02/26/15 [phone]

## 2015-02-24 NOTE — ED Notes (Signed)
Patient complaining of worsening pain. Rates pain at 8. Dr Rogene Houston notified.

## 2015-02-24 NOTE — ED Notes (Signed)
Patient with no complaints at this time. Respirations even and unlabored. Skin warm/dry. Discharge instructions reviewed with patient at this time. Patient given opportunity to voice concerns/ask questions. IV removed per policy and band-aid applied to site. Patient discharged at this time and left Emergency Department with steady gait.  

## 2015-05-13 ENCOUNTER — Emergency Department (HOSPITAL_COMMUNITY): Payer: Managed Care, Other (non HMO)

## 2015-05-13 ENCOUNTER — Emergency Department (HOSPITAL_COMMUNITY)
Admission: EM | Admit: 2015-05-13 | Discharge: 2015-05-14 | Disposition: A | Discharge status: Home / Self Care | Payer: Managed Care, Other (non HMO) | Attending: Emergency Medicine | Admitting: Emergency Medicine

## 2015-05-13 ENCOUNTER — Encounter (HOSPITAL_COMMUNITY): Payer: Self-pay

## 2015-05-13 DIAGNOSIS — J40 Bronchitis, not specified as acute or chronic: Secondary | ICD-10-CM

## 2015-05-13 DIAGNOSIS — J45901 Unspecified asthma with (acute) exacerbation: Secondary | ICD-10-CM | POA: Diagnosis not present

## 2015-05-13 DIAGNOSIS — Z862 Personal history of diseases of the blood and blood-forming organs and certain disorders involving the immune mechanism: Secondary | ICD-10-CM | POA: Insufficient documentation

## 2015-05-13 DIAGNOSIS — Z87891 Personal history of nicotine dependence: Secondary | ICD-10-CM | POA: Insufficient documentation

## 2015-05-13 DIAGNOSIS — Z8543 Personal history of malignant neoplasm of ovary: Secondary | ICD-10-CM | POA: Diagnosis not present

## 2015-05-13 DIAGNOSIS — Z79899 Other long term (current) drug therapy: Secondary | ICD-10-CM | POA: Diagnosis not present

## 2015-05-13 DIAGNOSIS — Z86718 Personal history of other venous thrombosis and embolism: Secondary | ICD-10-CM | POA: Diagnosis not present

## 2015-05-13 DIAGNOSIS — M791 Myalgia: Secondary | ICD-10-CM | POA: Insufficient documentation

## 2015-05-13 DIAGNOSIS — R05 Cough: Secondary | ICD-10-CM | POA: Diagnosis present

## 2015-05-13 MED ORDER — HYDROCOD POLST-CPM POLST ER 10-8 MG/5ML PO SUER
5.0000 mL | Freq: Once | ORAL | Status: AC
  Administered 2015-05-13: 5 mL via ORAL
  Filled 2015-05-13: qty 5

## 2015-05-13 MED ORDER — IPRATROPIUM-ALBUTEROL 0.5-2.5 (3) MG/3ML IN SOLN
3.0000 mL | Freq: Once | RESPIRATORY_TRACT | Status: AC
  Administered 2015-05-13: 3 mL via RESPIRATORY_TRACT
  Filled 2015-05-13: qty 3

## 2015-05-13 NOTE — ED Provider Notes (Signed)
CSN: [id number]     Arrival date & time 05/13/15  2231 History   None    Chief Complaint  Patient presents with  . URI     (Consider location/radiation/quality/duration/timing/severity/associated sxs/prior Treatment) Patient is a 34 y.o. female presenting with URI. The history is provided by the patient.  URI Presenting symptoms: congestion and cough   Severity:  Moderate Onset quality:  Gradual Duration:  1 week Progression:  Worsening Chronicity:  New Relieved by:  Nothing Worsened by:  Nothing tried Associated symptoms: myalgias and wheezing    Donika E Urda is a 34 y.o. female with hx of asthma presents to the ED with productive cough and congestion that started a week ago and has gotten worse. She feels short of breath due to coughing so much. Patient recently stopped smoking.   Past Medical History  Diagnosis Date  . Anemia 2008  . Asthma 2010  . DVT (deep venous thrombosis) ([place])     location unspecified   . Cancer [facility]) 2008    left ovary benign mass   Past Surgical History  Procedure Laterality Date  . Cesarean section  2009, 2011    indication unknown  . Salpingoophorectomy  2007    indication is ovarian cyst, side unknown   . Dilation and curettage of uterus     Family History  Problem Relation Age of Onset  . Breast cancer Mother 57  . Breast cancer Maternal Grandmother 73  . Breast cancer Paternal Grandmother 102  . Hypertension Father   . Stroke Father   . Hyperlipidemia Father   . Heart failure Father   . Diabetes Father   . Osteoarthritis Father   . Cancer Mother   . Cancer Maternal Grandmother   . Cancer Paternal Grandmother    Social History  Substance Use Topics  . Smoking status: Former Smoker -- 0.10 packs/day for 15 years    Types: Cigarettes  . Smokeless tobacco: Never Used  . Alcohol Use: No   OB History    No data available     Review of Systems  HENT: Positive for congestion.   Respiratory: Positive for cough and  wheezing.   Musculoskeletal: Positive for myalgias.  all other systems negtive    Allergies  Tomato and Adhesive  Home Medications   Prior to Admission medications   Medication Sig Start Date End Date Taking? Authorizing Provider  Ascorbic Acid (VITAMIN C PO) Take 1 tablet by mouth daily.   Yes Historical Provider, MD  Dextromethorphan HBr (COUGH SUPPRESSANT PO) Take 10 mLs by mouth daily as needed (for cough).   Yes Historical Provider, MD  dextromethorphan-guaiFENesin (MUCINEX DM) 30-600 MG 12hr tablet Take 1 tablet by mouth 2 (two) times daily as needed for cough.   Yes Historical Provider, MD  etonogestrel (IMPLANON) 68 MG IMPL implant Inject 1 each into the skin once. Implanted approx. September 2011   Yes Historical Provider, MD  loratadine (CLARITIN) 10 MG tablet Take 10 mg by mouth daily as needed for allergies.   Yes Historical Provider, MD  megestrol (MEGACE) 40 MG tablet Take 1 tablet (40 mg total) by mouth 3 (three) times daily. Patient taking differently: Take 40 mg by mouth 2 (two) times daily as needed (menstrual bleeding).  10/10/14  Yes Jonnie Kind, MD  Multiple Vitamins-Minerals (MULTIVITAMIN WITH MINERALS) tablet Take 1 tablet by mouth daily.   Yes Historical Provider, MD  azithromycin (ZITHROMAX) 250 MG tablet Take one tablet PO daily 05/14/15   Hope  Bunnie Pion, NP  chlorpheniramine-HYDROcodone (TUSSIONEX PENNKINETIC ER) 10-8 MG/5ML SUER Take 5 mLs by mouth every 12 (twelve) hours as needed for cough. 05/14/15   Hope Bunnie Pion, NP  HYDROcodone-acetaminophen (HYCET) 7.5-325 mg/15 ml solution Take 15 mLs by mouth every 6 (six) hours as needed for moderate pain. Patient not taking: Reported on 02/24/2015 11/27/14   Davonna Belling, MD  methocarbamol (ROBAXIN) 500 MG tablet Take 1 tablet (500 mg total) by mouth 3 (three) times daily. Patient not taking: Reported on 05/13/2015 02/24/15   Tammy Triplett, PA-C  predniSONE (DELTASONE) 10 MG tablet Take 2 tablets (20 mg total) by mouth  2 (two) times daily with a meal. 05/14/15   Hope Bunnie Pion, NP   BP 133/67 mmHg  Pulse 92  Temp(Src) 98.7 F (37.1 C) (Oral)  Resp 18  Ht 5\' 4"  (1.626 m)  Wt 304 lb (137.893 kg)  BMI 52.16 kg/m2  SpO2 98% Physical Exam  Constitutional: She is oriented to person, place, and time. No distress.  Morbidly obese  HENT:  Head: Normocephalic and atraumatic.  Right Ear: Tympanic membrane normal.  Left Ear: Tympanic membrane normal.  Nose: Rhinorrhea present.  Mouth/Throat: Uvula is midline and mucous membranes are normal. Posterior oropharyngeal erythema present.  Eyes: Conjunctivae and EOM are normal.  Neck: Normal range of motion. Neck supple.  Cardiovascular: Normal rate and regular rhythm.   Pulmonary/Chest: Effort normal. She has decreased breath sounds. Wheezes: occasional.  Abdominal: Soft. There is no tenderness.  Musculoskeletal: Normal range of motion.  Neurological: She is alert and oriented to person, place, and time. No cranial nerve deficit.  Skin: Skin is warm and dry.  Psychiatric: She has a normal mood and affect. Her behavior is normal.  Nursing note and vitals reviewed.   ED Course  Procedures (including critical care time) Albuterol/atrovent neb treatment, CXR, Tussionex 5 ml, Prednisone 60 mg PO, Zithromax 500 mg PO Patient re examined after treatment and is feeling better. No wheezing heard on exam. States she still feels some shortness of breath when the coughing spells come.  Ambulated patient and O2 SAT remained at 100% on R/A during ambulation.   Labs Review Labs Reviewed - No data to display  Imaging Review Dg Chest 2 View  05/13/2015   CLINICAL DATA:  Cough and congestion for 1 week  EXAM: CHEST  2 VIEW  COMPARISON:  02/24/2015  FINDINGS: Upper normal heart size. Lungs are under aerated with basilar atelectasis. No pleural effusion or pneumothorax.  IMPRESSION: Bibasilar atelectasis.   Electronically Signed   By: Marybelle Killings M.D.   On: 05/13/2015 23:21    I have personally reviewed and evaluated these images as part of my medical decision-making.   MDM  34 y.o. female with hx of asthma and recently stopped smoking with cough, congestion and aching all over x 1 week stable for d/c in no respiratory distress. Symptoms improved with treatment. Will continue prednisone, albuterol inhaler, zithromax and Tussionex. Discussed with the patient clinical and x-ray findings and plan of care. All questioned fully answered. She will return if any problems arise.  Final diagnoses:  Banks, NP 05/14/15 1749  Rolland Porter, MD 05/14/15 [phone]

## 2015-05-13 NOTE — ED Notes (Signed)
Cough and congestion, that started last week per pt. Getting worse per pt. Having chills per pt.

## 2015-05-14 MED ORDER — HYDROCODONE-ACETAMINOPHEN 5-325 MG PO TABS
1.0000 | ORAL_TABLET | Freq: Once | ORAL | Status: AC
Start: 2015-05-14 — End: 2015-05-14
  Administered 2015-05-14: 1 via ORAL
  Filled 2015-05-14: qty 1

## 2015-05-14 MED ORDER — AZITHROMYCIN 250 MG PO TABS
500.0000 mg | ORAL_TABLET | Freq: Once | ORAL | Status: AC
  Administered 2015-05-14: 500 mg via ORAL
  Filled 2015-05-14: qty 2

## 2015-05-14 MED ORDER — HYDROCOD POLST-CPM POLST ER 10-8 MG/5ML PO SUER: 5.0000 mL | Freq: Two times a day (BID) | ORAL | Status: DC | PRN

## 2015-05-14 MED ORDER — PREDNISONE 10 MG PO TABS: 20.0000 mg | ORAL_TABLET | Freq: Two times a day (BID) | ORAL | Status: DC

## 2015-05-14 MED ORDER — ALBUTEROL SULFATE HFA 108 (90 BASE) MCG/ACT IN AERS
2.0000 | INHALATION_SPRAY | RESPIRATORY_TRACT | Status: DC | PRN
  Administered 2015-05-14: 2 via RESPIRATORY_TRACT

## 2015-05-14 MED ORDER — PREDNISONE 50 MG PO TABS
60.0000 mg | ORAL_TABLET | Freq: Once | ORAL | Status: AC
  Administered 2015-05-14: 60 mg via ORAL
  Filled 2015-05-14 (×2): qty 1

## 2015-05-14 MED ORDER — AZITHROMYCIN 250 MG PO TABS: ORAL_TABLET | ORAL | Status: DC

## 2015-05-14 NOTE — ED Notes (Signed)
Pt walked around the nurses station her O2 was 100% on room air. Pt stated that she still feels short of breath when she walks. Pt also stated that when she starts walking she get coughing spells and cant stop coughing.

## 2015-07-17 ENCOUNTER — Telehealth: Payer: Self-pay | Admitting: *Deleted

## 2015-07-18 ENCOUNTER — Other Ambulatory Visit: Payer: Self-pay | Admitting: Obstetrics and Gynecology

## 2015-07-18 NOTE — Telephone Encounter (Signed)
Needs appt

## 2015-07-22 ENCOUNTER — Telehealth: Payer: Self-pay | Admitting: *Deleted

## 2015-09-09 NOTE — Telephone Encounter (Signed)
Pt has appt on file

## 2015-09-17 ENCOUNTER — Other Ambulatory Visit: Payer: Managed Care, Other (non HMO) | Admitting: Obstetrics and Gynecology

## 2016-07-14 ENCOUNTER — Ambulatory Visit (INDEPENDENT_AMBULATORY_CARE_PROVIDER_SITE_OTHER): Payer: Self-pay | Admitting: Adult Health

## 2016-07-14 ENCOUNTER — Encounter (INDEPENDENT_AMBULATORY_CARE_PROVIDER_SITE_OTHER): Payer: Self-pay

## 2016-07-14 ENCOUNTER — Encounter: Payer: Self-pay | Admitting: Adult Health

## 2016-07-14 VITALS — BP 148/90 | HR 76 | Ht 64.5 in | Wt 291.0 lb

## 2016-07-14 DIAGNOSIS — F32A Depression, unspecified: Secondary | ICD-10-CM

## 2016-07-14 DIAGNOSIS — Z3202 Encounter for pregnancy test, result negative: Secondary | ICD-10-CM

## 2016-07-14 DIAGNOSIS — F329 Major depressive disorder, single episode, unspecified: Secondary | ICD-10-CM

## 2016-07-14 DIAGNOSIS — Z3046 Encounter for surveillance of implantable subdermal contraceptive: Secondary | ICD-10-CM

## 2016-07-14 DIAGNOSIS — Z319 Encounter for procreative management, unspecified: Secondary | ICD-10-CM

## 2016-07-14 DIAGNOSIS — I10 Essential (primary) hypertension: Secondary | ICD-10-CM

## 2016-07-14 DIAGNOSIS — Z3049 Encounter for surveillance of other contraceptives: Secondary | ICD-10-CM

## 2016-07-14 LAB — POCT URINE PREGNANCY: Preg Test, Ur: NEGATIVE

## 2016-07-14 MED ORDER — HYDROCHLOROTHIAZIDE 12.5 MG PO CAPS: 12.5000 mg | ORAL_CAPSULE | Freq: Every day | ORAL | 6 refills | Status: DC

## 2016-07-14 MED ORDER — PRENATAL PLUS 27-1 MG PO TABS: 1.0000 | ORAL_TABLET | Freq: Every day | ORAL | 12 refills | Status: AC

## 2016-07-14 MED ORDER — CITALOPRAM HYDROBROMIDE 20 MG PO TABS: 20.0000 mg | ORAL_TABLET | Freq: Every day | ORAL | 6 refills | Status: AC

## 2016-07-14 NOTE — Progress Notes (Signed)
Subjective:     Patient ID: [name], female   DOB: 02-Oct-1980, 35 y.o.   MRN: [id number]  HPI Pamela is a 35 year old white female in for implanon removal, it was inserted in 2011 and it is broken and has been since insertion she says.  Review of Systems For implanon removal and she says it is broken and has been since insertion. Reviewed past medical,surgical, social and family history. Reviewed medications and allergies.     Objective:   Physical Exam BP (!) 148/90 (BP Location: Right Arm, Patient Position: Sitting, Cuff Size: Large)   Pulse 76   Ht 5' 4.5" (1.638 m)   Wt 291 lb (132 kg)   BMI 49.18 kg/m UPT negative,Consent signed, left arm cleansed with betadine, and injected with 1.5 cc 1% lidocaine and waited til numb.Under sterile technique a #11 blade was used to make small vertical incision, and a curved forceps was used to remove rod, which was broken in half, then was injected few cm up with 1 cc 1% lidocaine and waited til numb and small vertical incision was made and last half of rod was removed. Steri strips applied. Pressure dressing applied.Rod measured 4 cm, Levy Pupa LPN was in to assist with removal and second half and watched me measure rod. PHQ 9 score 17, she is dealing with depression, but denies any suicidal ideation, will get insurance in January. Will give celexa for depression now and BP meds, and bring back in January for pap and physical and labs.She wants to get pregnant in near future.    Assessment:     1. Encounter for Implanon removal   2. Pregnancy examination or test, negative result   3. Essential hypertension   4. Depression, unspecified depression type   5. Patient desires pregnancy       Plan:     Meds ordered this encounter  Medications  . acetaminophen (TYLENOL) 650 MG CR tablet    Sig: Take 650 mg by mouth as needed.   . prenatal vitamin w/FE, FA (PRENATAL 1 + 1) 27-1 MG TABS tablet    Sig: Take 1 tablet by mouth daily at 12  noon.    Dispense:  30 each    Refill:  12    Order Specific Question:   Supervising Provider    Answer:   Elonda Husky, LUTHER H [[id number]]  . hydrochlorothiazide (MICROZIDE) 12.5 MG capsule    Sig: Take 1 capsule (12.5 mg total) by mouth daily.    Dispense:  30 capsule    Refill:  6    Order Specific Question:   Supervising Provider    Answer:   Elonda Husky, LUTHER H [[id number]]  . citalopram (CELEXA) 20 MG tablet    Sig: Take 1 tablet (20 mg total) by mouth daily.    Dispense:  30 tablet    Refill:  6    Order Specific Question:   Supervising Provider    Answer:   Florian Buff [[id number]]     Use condoms x 2 weeks, keep clean and dry x 24 hours, no heavy lifting, keep steri strips on x 72 hours, Keep pressure dressing on x 24 hours.Gave extra steri strips and gauze  Follow up in 4 weeks for pap and physical and labs

## 2016-07-14 NOTE — Patient Instructions (Signed)
Use condoms x 2 weeks, keep clean and dry x 24 hours, no heavy lifting, keep steri strips on x 72 hours, Keep pressure dressing on x 24 hours. Follow up prn problems. Take BP meds in am Celexa in am Follow up in 4 weeks for pap and physical

## 2016-08-13 ENCOUNTER — Ambulatory Visit (INDEPENDENT_AMBULATORY_CARE_PROVIDER_SITE_OTHER): Payer: BLUE CROSS/BLUE SHIELD | Admitting: Obstetrics and Gynecology

## 2016-08-13 ENCOUNTER — Encounter: Payer: Self-pay | Admitting: Obstetrics and Gynecology

## 2016-08-13 ENCOUNTER — Other Ambulatory Visit (HOSPITAL_COMMUNITY)
Admission: RE | Admit: 2016-08-13 | Discharge: 2016-08-13 | Disposition: A | Discharge status: Home / Self Care | Payer: Managed Care, Other (non HMO) | Source: Ambulatory Visit | Attending: Obstetrics and Gynecology | Admitting: Obstetrics and Gynecology

## 2016-08-13 VITALS — BP 142/95 | HR 74 | Ht 64.57 in | Wt 290.4 lb

## 2016-08-13 DIAGNOSIS — Z113 Encounter for screening for infections with a predominantly sexual mode of transmission: Secondary | ICD-10-CM | POA: Insufficient documentation

## 2016-08-13 DIAGNOSIS — Z01419 Encounter for gynecological examination (general) (routine) without abnormal findings: Secondary | ICD-10-CM | POA: Diagnosis not present

## 2016-08-13 DIAGNOSIS — Z1151 Encounter for screening for human papillomavirus (HPV): Secondary | ICD-10-CM | POA: Insufficient documentation

## 2016-08-13 MED ORDER — HYDROCHLOROTHIAZIDE 12.5 MG PO CAPS: 12.5000 mg | ORAL_CAPSULE | Freq: Every day | ORAL | 11 refills | Status: AC

## 2016-08-13 MED ORDER — AMLODIPINE BESYLATE 5 MG PO TABS: 5.0000 mg | ORAL_TABLET | Freq: Every day | ORAL | 3 refills | Status: AC

## 2016-08-13 NOTE — Progress Notes (Signed)
Patient ID: [name], female   DOB: July 05, 1981, 36 y.o.   MRN: [id number]  Assessment:  Annual Gyn Exam   Plan:  1. pap smear done, next pap due 3 years 2. STD testing 3. Check A1C, TSH, LFT, cholesterol levels 4. Follow up for IUD insertion PRN 5. Refill HTN medications and Rx Norvasc  6. return annually or prn 7    Annual mammogram advised Subjective:  [name] is a 36 y.o. female G2P2002 who presents for annual exam. Patient's last menstrual period was 07/14/2016. The patient has complaints today of no complaints at this time. Pt notes that this is her first pap smear in awhile due to lack of insurance and she would like a full work up. Pt states that she had her implanon removed last month and she would like to discuss temporary contraceptive measures. Pt reports that she is sexually active, but she is using condoms at this time. Pt has a PMHx of DVT and she doesn't take anticoagulants at this time. Pt notes that she recently started exercising with a personal trainer and began dieting with weight watchers.   The following portions of the patient's history were reviewed and updated as appropriate: allergies, current medications, past family history, past medical history, past social history, past surgical history and problem list. Past Medical History:  Diagnosis Date  . Anemia 2008  . Asthma 2010  . 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 DVT (deep venous thrombosis) ([place])    location unspecified   . Hypertension   . Mental disorder    depression    Past Surgical History:  Procedure Laterality Date  . CESAREAN SECTION  2009, 2011   indication unknown  . DILATION AND CURETTAGE OF UTERUS    . SALPINGOOPHORECTOMY  2007   indication is ovarian cyst, side unknown    Social hx  ENGAGED  Current Outpatient Prescriptions:  .  acetaminophen (TYLENOL) 650 MG CR tablet, Take 650 mg by mouth as needed. , Disp: , Rfl:  .  Ascorbic Acid (VITAMIN C PO), Take 1  tablet by mouth daily., Disp: , Rfl:  .  citalopram (CELEXA) 20 MG tablet, Take 1 tablet (20 mg total) by mouth daily., Disp: 30 tablet, Rfl: 6 .  hydrochlorothiazide (MICROZIDE) 12.5 MG capsule, Take 1 capsule (12.5 mg total) by mouth daily., Disp: 30 capsule, Rfl: 6 .  Multiple Vitamins-Minerals (MULTIVITAMIN WITH MINERALS) tablet, Take 1 tablet by mouth daily., Disp: , Rfl:  .  prenatal vitamin w/FE, FA (PRENATAL 1 + 1) 27-1 MG TABS tablet, Take 1 tablet by mouth daily at 12 noon., Disp: 30 each, Rfl: 12  Review of Systems Constitutional: negative Gastrointestinal: negative Genitourinary: negative  Objective:  Wt 290 lb 6.4 oz (131.7 kg)   LMP 07/14/2016   BMI 49.08 kg/m    BMI: Body mass index is 49.08 kg/m.  General Appearance: Alert, appropriate appearance for age. No acute distress HEENT: Grossly normal Neck / Thyroid:  Cardiovascular: RRR; normal S1, S2, no murmur Lungs: CTA bilaterally Back: No CVAT Breast Exam: No masses or nodes.No dimpling, nipple retraction or discharge. Gastrointestinal: Soft, non-tender, no masses or organomegaly Pelvic Exam: External genitalia: normal general appearance Vaginal: normal mucosa without prolapse or lesions, normal without tenderness, induration or masses and normal rugae Cervix: normal appearance Adnexa: normal bimanual exam Uterus: normal single, nontender Rectovaginal: not indicated Lymphatic Exam: Non-palpable nodes in neck, clavicular, axillary, or inguinal regions  Skin: no rash or abnormalities  Neurologic: Normal gait and speech, no tremor  Psychiatric: Alert and oriented, appropriate affect.  Urinalysis:Not done   Discussion: Discussed with pt weight loss methods including food measurement and calorie counting using apps such as MyNetDiary or My Fitness Pal. Recommended the use of measuring cups and spoons to monitor serving sizes. Encouraged adequate daily water intake, especially prior to meals to eliminate overeating.  Additionally encouraged pt to become more active by taking daily walks of at least 30 minute duration, join a local gym such as the [facility] or attend water aerobics classes. Also advised pt to use pedometer on smartphone or utilize a smartband such as FitBit to keep track of daily activity.   At end of discussion, pt had opportunity to ask questions and has no further questions at this time.   Specific discussion of lifestyle changes, behavioral modifications and healthy eating habits as noted above. Greater than 50% was spent in counseling and coordination of care with the patient.  Total time greater than: 25 minutes.    Mallory Shirk. MD Pgr [phone] 12:36 PM   By signing my name below, I, Soijett Blue, attest that this documentation has been prepared under the direction and in the presence of Jonnie Kind, MD. Electronically Signed: Soijett Blue, ED Scribe. 08/13/16. 12:36 PM.  I personally performed the services described in this documentation, which was SCRIBED in my presence. The recorded information has been reviewed and considered accurate. It has been edited as necessary during review. Jonnie Kind, MD

## 2016-08-14 LAB — COMPREHENSIVE METABOLIC PANEL
A/G RATIO: 1.7 (ref 1.2–2.2)
ALBUMIN: 4.2 g/dL (ref 3.5–5.5)
ALT: 20 IU/L (ref 0–32)
AST: 11 IU/L (ref 0–40)
Alkaline Phosphatase: 59 IU/L (ref 39–117)
BUN / CREAT RATIO: 13 (ref 9–23)
BUN: 9 mg/dL (ref 6–20)
CHLORIDE: 104 mmol/L (ref 96–106)
CO2: 21 mmol/L (ref 18–29)
Calcium: 9.4 mg/dL (ref 8.7–10.2)
Creatinine, Ser: 0.71 mg/dL (ref 0.57–1.00)
GFR calc non Af Amer: 110 mL/min/{1.73_m2} (ref >59)
GFR, EST AFRICAN AMERICAN: 127 mL/min/{1.73_m2} (ref >59)
Globulin, Total: 2.5 g/dL (ref 1.5–4.5)
Glucose: 117 mg/dL — ABNORMAL HIGH (ref 65–99)
POTASSIUM: 4.3 mmol/L (ref 3.5–5.2)
Sodium: 140 mmol/L (ref 134–144)
TOTAL PROTEIN: 6.7 g/dL (ref 6.0–8.5)

## 2016-08-14 LAB — RPR: RPR: REACTIVE — AB

## 2016-08-14 LAB — HEMOGLOBIN A1C
Est. average glucose Bld gHb Est-mCnc: 157 mg/dL
Hgb A1c MFr Bld: 7.1 % — ABNORMAL HIGH (ref 4.8–5.6)

## 2016-08-14 LAB — LIPID PANEL
CHOL/HDL RATIO: 4.8 ratio — AB (ref 0.0–4.4)
Cholesterol, Total: 158 mg/dL (ref 100–199)
HDL: 33 mg/dL — ABNORMAL LOW (ref >39)
LDL CALC: 95 mg/dL (ref 0–99)
TRIGLYCERIDES: 152 mg/dL — AB (ref 0–149)
VLDL Cholesterol Cal: 30 mg/dL (ref 5–40)

## 2016-08-14 LAB — CBC
HEMATOCRIT: 38.7 % (ref 34.0–46.6)
HEMOGLOBIN: 12.8 g/dL (ref 11.1–15.9)
MCH: 26.3 pg — AB (ref 26.6–33.0)
MCHC: 33.1 g/dL (ref 31.5–35.7)
MCV: 80 fL (ref 79–97)
Platelets: 245 10*3/uL (ref 150–379)
RBC: 4.87 x10E6/uL (ref 3.77–5.28)
RDW: 15.4 % (ref 12.3–15.4)
WBC: 8.1 10*3/uL (ref 3.4–10.8)

## 2016-08-14 LAB — TSH: TSH: 1.75 u[IU]/mL (ref 0.450–4.500)

## 2016-08-14 LAB — RPR, QUANT+TP ABS (REFLEX)
Rapid Plasma Reagin, Quant: 1:1 {titer} — ABNORMAL HIGH
T Pallidum Abs: POSITIVE — AB

## 2016-08-14 LAB — HIV ANTIBODY (ROUTINE TESTING W REFLEX): HIV Screen 4th Generation wRfx: NONREACTIVE

## 2016-08-16 ENCOUNTER — Telehealth: Payer: Self-pay | Admitting: *Deleted

## 2016-08-16 NOTE — Telephone Encounter (Signed)
[name] with the [facility] Dept called regarding pt's + RPR. Dr. Glo Herring left note advising pt to make an appt to discuss. I tried to call pt but was unable to leave a message due to mailbox full. No other number listed. According to Dr. Johnnye Sima note, pt was not showing any symptoms. Will wait and see if pt gets in touch with [place]. [place]

## 2016-08-17 ENCOUNTER — Other Ambulatory Visit: Payer: Self-pay | Admitting: Adult Health

## 2016-08-17 ENCOUNTER — Telehealth: Payer: Self-pay | Admitting: Obstetrics and Gynecology

## 2016-08-17 NOTE — Telephone Encounter (Signed)
Spoke with patient regarding results. Patient wants to reschedule appointment. Transferred call to reschedule.

## 2016-08-17 NOTE — Telephone Encounter (Signed)
Left message for pt to contact office due to positive STI results.

## 2016-08-18 ENCOUNTER — Ambulatory Visit (INDEPENDENT_AMBULATORY_CARE_PROVIDER_SITE_OTHER): Payer: BLUE CROSS/BLUE SHIELD | Admitting: Obstetrics and Gynecology

## 2016-08-18 ENCOUNTER — Encounter: Payer: Self-pay | Admitting: Obstetrics and Gynecology

## 2016-08-18 ENCOUNTER — Ambulatory Visit: Payer: BLUE CROSS/BLUE SHIELD

## 2016-08-18 VITALS — BP 132/82 | HR 76 | Wt 288.6 lb

## 2016-08-18 DIAGNOSIS — E1169 Type 2 diabetes mellitus with other specified complication: Secondary | ICD-10-CM

## 2016-08-18 DIAGNOSIS — Z6841 Body Mass Index (BMI) 40.0 and over, adult: Secondary | ICD-10-CM | POA: Diagnosis not present

## 2016-08-18 DIAGNOSIS — E669 Obesity, unspecified: Secondary | ICD-10-CM

## 2016-08-18 DIAGNOSIS — A539 Syphilis, unspecified: Secondary | ICD-10-CM | POA: Diagnosis not present

## 2016-08-18 LAB — CYTOLOGY - PAP
Chlamydia: NEGATIVE
Diagnosis: NEGATIVE
HPV: DETECTED — AB
NEISSERIA GONORRHEA: NEGATIVE

## 2016-08-18 MED ORDER — PENICILLIN G BENZATHINE 1200000 UNIT/2ML IM SUSP: 2.4000 10*6.[IU] | Freq: Once | INTRAMUSCULAR | Status: DC

## 2016-08-18 MED ORDER — GLUCOSE BLOOD VI STRP
ORAL_STRIP | 12 refills | Status: AC
Start: 2016-08-18 — End: ?

## 2016-08-18 NOTE — Progress Notes (Addendum)
[facility] Visit  08/18/16          Patient name: [name] MRN [id number]  Date of birth: September 25, 1980  CC & HPI:  [name] is a 36 y.o. female presenting today for follow up on test results. Patient was last seen in the office on 08/13/16 and tested + for syphilis. She also had an A1c of 7.1.   ROS:  ROS Negative, no new complaints   Pertinent History Reviewed:   Reviewed: Significant for  Medical         Past Medical History:  Diagnosis Date  . Anemia 2008  . Asthma 2010  . Cancer [facility] 2008   left ovary benign mass  . DVT (deep venous thrombosis) ([place])    location unspecified   . Hypertension   . Mental disorder    depression                              Surgical Hx:    Past Surgical History:  Procedure Laterality Date  . CESAREAN SECTION  2009, 2011   indication unknown  . DILATION AND CURETTAGE OF UTERUS    . SALPINGOOPHORECTOMY  2007   indication is ovarian cyst, side unknown    Medications: Reviewed & Updated - see associated section                       Current Outpatient Prescriptions:  .  acetaminophen (TYLENOL) 650 MG CR tablet, Take 650 mg by mouth as needed. , Disp: , Rfl:  .  amLODipine (NORVASC) 5 MG tablet, Take 1 tablet (5 mg total) by mouth daily., Disp: 90 tablet, Rfl: 3 .  Ascorbic Acid (VITAMIN C PO), Take 1 tablet by mouth daily., Disp: , Rfl:  .  citalopram (CELEXA) 20 MG tablet, Take 1 tablet (20 mg total) by mouth daily., Disp: 30 tablet, Rfl: 6 .  hydrochlorothiazide (MICROZIDE) 12.5 MG capsule, Take 1 capsule (12.5 mg total) by mouth daily., Disp: 30 capsule, Rfl: 11 .  Multiple Vitamins-Minerals (MULTIVITAMIN WITH MINERALS) tablet, Take 1 tablet by mouth daily., Disp: , Rfl:  .  prenatal vitamin w/FE, FA (PRENATAL 1 + 1) 27-1 MG TABS tablet, Take 1 tablet by mouth daily at 12 noon., Disp: 30 each, Rfl: 12   Social History: Reviewed -  reports that she has quit smoking. Her smoking use included Cigarettes. She has a  1.50 pack-year smoking history. She has never used smokeless tobacco.  Objective Findings:  Vitals: Blood pressure 132/82, pulse 76, weight 288 lb 9.6 oz (130.9 kg), last menstrual period 07/14/2016.  Physical Examination: not indicated at this time  Discussion of dx  The provider spent over 25 minutes with the visit , including previsit review, and documentation,with >than 50% spent in counseling and coordination of care. Assessment & Plan:   A:  1. Syphilis infection new Dx 2. Diabetes mellitus  r  P:  1. Follow up tomorrow for injection 2. Treat with [facility] Bicillin [facility] to return with Rx for injection. 3 refer to dDr Dorris Fetch for DM CARe. Given Rx for glucometer.    By signing my name below, I, Sonum Patel, attest that this documentation has been prepared under the direction and in the presence of Jonnie Kind, MD. Electronically Signed: Sonum Patel, Education administrator. 08/18/16. 3:56 PM.  I personally performed the services described in this documentation, which  was SCRIBED in my presence. The recorded information has been reviewed and considered accurate. It has been edited as necessary during review. Jonnie Kind, MD

## 2016-08-19 ENCOUNTER — Other Ambulatory Visit: Payer: Self-pay | Admitting: Obstetrics and Gynecology

## 2016-08-19 ENCOUNTER — Telehealth: Payer: Self-pay | Admitting: Obstetrics and Gynecology

## 2016-08-19 DIAGNOSIS — E1169 Type 2 diabetes mellitus with other specified complication: Secondary | ICD-10-CM | POA: Insufficient documentation

## 2016-08-19 DIAGNOSIS — E669 Obesity, unspecified: Secondary | ICD-10-CM

## 2016-08-19 DIAGNOSIS — A539 Syphilis, unspecified: Secondary | ICD-10-CM | POA: Insufficient documentation

## 2016-08-19 MED ORDER — PENICILLIN G BENZATHINE 1200000 UNIT/2ML IM SUSP: 2.4000 10*6.[IU] | Freq: Once | INTRAMUSCULAR | 0 refills | Status: AC

## 2016-08-20 ENCOUNTER — Telehealth: Payer: Self-pay | Admitting: *Deleted

## 2016-08-20 NOTE — Progress Notes (Signed)
Put pt is recall for 1 year

## 2016-08-20 NOTE — Telephone Encounter (Signed)
[name] from the [facility] called wanting to know if pt had been checked for RPR in the past. I checked pt's chart and didn't see it at first but pt was checked 07/21/08 and 02/18/10 and both were non reactive. I called [phone] and spoke with Phylis Bougie and gave her the info. [name] will be there today and Katharine Look will let her know. [place]

## 2016-08-31 ENCOUNTER — Telehealth: Payer: Self-pay | Admitting: Obstetrics and Gynecology

## 2016-08-31 NOTE — Telephone Encounter (Signed)
Called patient to get more info on request.

## 2016-08-31 NOTE — Telephone Encounter (Signed)
Pt called stating that she was sent to [place] to get a shot and was told to call if those injection was giving her a yeast infection. Pt states that she needs something called into the [place]. Please contact pt

## 2016-09-01 ENCOUNTER — Telehealth: Payer: Self-pay | Admitting: *Deleted

## 2016-09-01 DIAGNOSIS — B373 Candidiasis of vulva and vagina: Secondary | ICD-10-CM

## 2016-09-01 DIAGNOSIS — B3731 Acute candidiasis of vulva and vagina: Secondary | ICD-10-CM

## 2016-09-01 NOTE — Telephone Encounter (Signed)
Erroneous encounter

## 2016-09-01 NOTE — Telephone Encounter (Signed)
Patient has [facility] up her [facility] and states she is now getting a yeast infection. She would like for you to send in a prescription for Diflucan. She also has not heard from the Dietician you referred her too. She is having problems with acid reflux and would like to know if you have any recommendations for something OTC.

## 2016-09-02 DIAGNOSIS — B373 Candidiasis of vulva and vagina: Secondary | ICD-10-CM | POA: Insufficient documentation

## 2016-09-02 DIAGNOSIS — B3731 Acute candidiasis of vulva and vagina: Secondary | ICD-10-CM | POA: Insufficient documentation

## 2016-09-02 MED ORDER — FLUCONAZOLE 150 MG PO TABS: 150.0000 mg | ORAL_TABLET | Freq: Once | ORAL | 1 refills | Status: AC

## 2016-09-02 NOTE — Addendum Note (Signed)
Addended by: Jonnie Kind on: 09/02/2016 10:15 PM   Modules accepted: Orders

## 2016-09-02 NOTE — Telephone Encounter (Signed)
Diflucan reordered.  PLease refer pt to Dietician at Dr Liliane Channel

## 2016-09-03 ENCOUNTER — Telehealth: Payer: Self-pay | Admitting: *Deleted

## 2016-09-03 NOTE — Telephone Encounter (Signed)
Referral sent to [facility] for Dietician referral per dr Glo Herring.

## 2016-11-10 ENCOUNTER — Ambulatory Visit (INDEPENDENT_AMBULATORY_CARE_PROVIDER_SITE_OTHER): Payer: BLUE CROSS/BLUE SHIELD | Admitting: Women's Health

## 2016-11-10 ENCOUNTER — Encounter: Payer: Self-pay | Admitting: Women's Health

## 2016-11-10 VITALS — BP 146/80 | HR 80 | Ht 63.5 in | Wt 305.0 lb

## 2016-11-10 DIAGNOSIS — Z3202 Encounter for pregnancy test, result negative: Secondary | ICD-10-CM

## 2016-11-10 DIAGNOSIS — Z30430 Encounter for insertion of intrauterine contraceptive device: Secondary | ICD-10-CM | POA: Insufficient documentation

## 2016-11-10 LAB — POCT URINE PREGNANCY: PREG TEST UR: NEGATIVE

## 2016-11-10 NOTE — Patient Instructions (Addendum)
Primary Care Providers  Dr. Wende [facility] [place]) [place] [phone]  [facility]) Dr. Anastasio Champion [phone]  [facility]) [place] [phone]  [facility]    Nothing in vagina for 3 days (no sex, douching, tampons, etc...)  Check your strings once a month to make sure you can feel them, if you are not able to please let us know  If you develop a fever of 100.4 or more in the next few weeks, or if you develop severe abdominal pain, please let [place] know  Use a backup method of birth control, such as condoms, for 2 weeks   Levonorgestrel intrauterine device (IUD) What is this medicine? LEVONORGESTREL IUD (LEE voe nor jes trel) is a contraceptive (birth control) device. The device is placed inside the uterus by a healthcare professional. It is used to prevent pregnancy. This device can also be used to treat heavy bleeding that occurs during your period. This medicine may be used for other purposes; ask your health care provider or pharmacist if you have questions. COMMON BRAND NAME(S): Minette Headland What should I tell my health care provider before I take this medicine? They need to know if you have any of these conditions: -abnormal Pap smear -cancer of the breast, uterus, or cervix -diabetes -endometritis -genital or pelvic infection now or in the past -have more than one sexual partner or your partner has more than one partner -heart disease -history of an ectopic or tubal pregnancy -immune system problems -IUD in place -liver disease or tumor -problems with blood clots or take blood-thinners -seizures -use intravenous drugs -uterus of unusual shape -vaginal bleeding that has not been explained -an unusual or allergic reaction to levonorgestrel, other hormones, silicone, or polyethylene, medicines, foods, dyes, or preservatives -pregnant or  trying to get pregnant -breast-feeding How should I use this medicine? This device is placed inside the uterus by a health care professional. Talk to your pediatrician regarding the use of this medicine in children. Special care may be needed. Overdosage: If you think you have taken too much of this medicine contact a poison control center or emergency room at once. NOTE: This medicine is only for you. Do not share this medicine with others. What if I miss a dose? This does not apply. Depending on the brand of device you have inserted, the device will need to be replaced every 3 to 5 years if you wish to continue using this type of birth control. What may interact with this medicine? Do not take this medicine with any of the following medications: -amprenavir -bosentan -fosamprenavir This medicine may also interact with the following medications: -aprepitant -armodafinil -barbiturate medicines for inducing sleep or treating seizures -bexarotene -boceprevir -griseofulvin -medicines to treat seizures like carbamazepine, ethotoin, felbamate, oxcarbazepine, phenytoin, topiramate -modafinil -pioglitazone -rifabutin -rifampin -rifapentine -some medicines to treat HIV infection like atazanavir, efavirenz, indinavir, lopinavir, nelfinavir, tipranavir, ritonavir -St. John's wort -warfarin This list may not describe all possible interactions. Give your health care provider a list of all the medicines, herbs, non-prescription drugs, or dietary supplements you use. Also tell them if you smoke, drink alcohol, or use illegal drugs. Some items may interact with your medicine. What should I watch for while using this medicine? Visit your doctor or health care professional for regular check ups. See your doctor if you or your partner has sexual contact with others, becomes HIV positive, or gets a sexual transmitted disease. This product  does not protect you against HIV infection (AIDS) or other  sexually transmitted diseases. You can check the placement of the IUD yourself by reaching up to the top of your vagina with clean fingers to feel the threads. Do not pull on the threads. It is a good habit to check placement after each menstrual period. Call your doctor right away if you feel more of the IUD than just the threads or if you cannot feel the threads at all. The IUD may come out by itself. You may become pregnant if the device comes out. If you notice that the IUD has come out use a backup birth control method like condoms and call your health care provider. Using tampons will not change the position of the IUD and are okay to use during your period. This IUD can be safely scanned with magnetic resonance imaging (MRI) only under specific conditions. Before you have an MRI, tell your healthcare provider that you have an IUD in place, and which type of IUD you have in place. What side effects may I notice from receiving this medicine? Side effects that you should report to your doctor or health care professional as soon as possible: -allergic reactions like skin rash, itching or hives, swelling of the face, lips, or tongue -fever, flu-like symptoms -genital sores -high blood pressure -no menstrual period for 6 weeks during use -pain, swelling, warmth in the leg -pelvic pain or tenderness -severe or sudden headache -signs of pregnancy -stomach cramping -sudden shortness of breath -trouble with balance, talking, or walking -unusual vaginal bleeding, discharge -yellowing of the eyes or skin Side effects that usually do not require medical attention (report to your doctor or health care professional if they continue or are bothersome): -acne -breast pain -change in sex drive or performance -changes in weight -cramping, dizziness, or faintness while the device is being inserted -headache -irregular menstrual bleeding within first 3 to 6 months of use -nausea This list may not  describe all possible side effects. Call your doctor for medical advice about side effects. You may report side effects to FDA at 1-800-FDA-1088. Where should I keep my medicine? This does not apply. NOTE: This sheet is a summary. It may not cover all possible information. If you have questions about this medicine, talk to your doctor, pharmacist, or health care provider.  2018 Elsevier/Gold Standard (2016-05-07 14:14:56)

## 2016-11-10 NOTE — Progress Notes (Signed)
[name] is a 36 y.o. year old G2P2002 Caucasian female who presents for placement of a Mirena IUD. She has had c/s x 2. Dx w/ syphilis in Jan, tx, POC neg w/ HD. Also dx w/ DM by [facility], has f/u w/ him. Needs list of pcp's- list given today.   Patient's last menstrual period was 11/07/2016. BP (!) 146/80 (BP Location: Right Arm, Patient Position: Sitting, Cuff Size: Large)   Pulse 80   Ht 5' 3.5" (1.613 m)   Wt (!) 305 lb (138.3 kg)   LMP 11/07/2016   BMI 53.18 kg/m  Last sexual intercourse was in Dec, and pregnancy test today was neg  The risks and benefits of the method and placement have been thouroughly reviewed with the patient and all questions were answered.  Specifically the patient is aware of failure rate of 08/998, expulsion of the IUD and of possible perforation.  The patient is aware of irregular bleeding due to the method and understands the incidence of irregular bleeding diminishes with time.  Signed copy of informed consent in chart.   Time out was performed.  A graves speculum was placed in the vagina.  The cervix was visualized, prepped using Betadine, and grasped with a single tooth tenaculum. The uterus was found to be anteroflexed and it sounded to 8 cm.  Mirena IUD placed per manufacturer's recommendations.   The strings were trimmed to 3 cm.  Sonogram was performed and the proper placement of the IUD was verified via transvaginal u/s by myself and amber, ultrasonographer.   The patient was given post procedure instructions, including signs and symptoms of infection and to check for the strings after each menses or each month, and refraining from intercourse or anything in the vagina for 3 days.  She was given a Mirena care card with date IUD placed, and date IUD to be removed.  She is scheduled for a f/u appointment in 4 weeks.  Tawnya Crook CNM, [facility] 11/10/2016 3:09 PM

## 2016-11-23 ENCOUNTER — Telehealth: Payer: Self-pay | Admitting: Women's Health

## 2016-11-23 NOTE — Telephone Encounter (Signed)
Informed patient of Kim's response: "I said, she can bleed daily or irregularly for first 3-42mths, after that if bleeding is still daily/irregular we can try medicine. Not at this point. It was just put in this month."  Patient stated that per Dr Glo Herring she could restart Megace now if she needed. Informed patient that I would send messaged to him and let him decide about that.   Dr Glo Herring,  Please advise

## 2016-11-23 NOTE — Telephone Encounter (Signed)
Pt called stating that she would like for Joelene Millin to call in something to stop her bleeding. Pt states that Maudie Mercury told her if she started bleeding a lot to call in. Please contact pt

## 2016-12-08 ENCOUNTER — Ambulatory Visit: Payer: BLUE CROSS/BLUE SHIELD | Admitting: Women's Health

## 2017-02-24 ENCOUNTER — Ambulatory Visit: Payer: Managed Care, Other (non HMO) | Admitting: Nutrition

## 2017-02-24 ENCOUNTER — Telehealth: Payer: Self-pay | Admitting: Nutrition

## 2017-02-24 NOTE — Telephone Encounter (Signed)
VM to call and reschedule missed appt. 

## 2017-03-25 ENCOUNTER — Other Ambulatory Visit: Payer: Self-pay | Admitting: *Deleted

## 2017-03-25 DIAGNOSIS — E1169 Type 2 diabetes mellitus with other specified complication: Secondary | ICD-10-CM

## 2017-03-25 DIAGNOSIS — E669 Obesity, unspecified: Principal | ICD-10-CM

## 2017-05-12 ENCOUNTER — Encounter: Payer: Managed Care, Other (non HMO) | Attending: Obstetrics and Gynecology | Admitting: Nutrition

## 2017-05-12 VITALS — Ht 63.5 in | Wt 309.5 lb

## 2017-05-12 DIAGNOSIS — E1165 Type 2 diabetes mellitus with hyperglycemia: Secondary | ICD-10-CM

## 2017-05-12 DIAGNOSIS — E118 Type 2 diabetes mellitus with unspecified complications: Principal | ICD-10-CM

## 2017-05-12 DIAGNOSIS — IMO0002 Reserved for concepts with insufficient information to code with codable children: Secondary | ICD-10-CM

## 2017-05-12 NOTE — Patient Instructions (Signed)
Goals 1. Follow My Plate 2. Eat 2-3 carb choices per meal 3. Cut out sweet tea and sodas Avoid skipping meals 4. Increase fresh fruits and vegetables. 5. Exercises 2-3 times per weeik 30 minutes 6. Test blood sugars twice  A day Lose 1 lb per week

## 2017-05-12 NOTE — Progress Notes (Signed)
Diabetes Self-Management Education  Visit Type: First/Initial  Appt. Start Time: 1100 Appt. End Time: 1200  05/12/2017  [name], identified by name and date of birth, is a 36 y.o. female with a diagnosis of Diabetes: Type 2. PMH: Elevated TG and low HDL. She notes she has family history of Hyperlipidemia and CAD.  TG 152 mg/dl. She has been diabetic for about 5 years. Lost insurance recently. Doesn't have a PCP right now. Sees OBGYN for now. She notes this is the biggest she has ever weighed. She wants to lose weight.. Desired weight 200 lbs. She wants to improve her diabetes.  A1C 7.1%. Not on any medications. Hasn't been testing regularly. BS occasionally 160-180's in am. Eats meals sporadically. Skips meals.  Current diet is excessive calories, fat, sodium and low in fresh fruits, vegetables and whole grains/fiber. Drinks regular soda. Complains of symptoms of low blood sugars and high blood sugars often.  She needs a PCP. [facility] her insurance comes through with her new job.  Willing to make changes with diet and exercise to provide weight loss and improve blood sugars.  ASSESSMENT  Height 5' 3.5" (1.613 m), weight (!) 309 lb 8 oz (140.4 kg). Body mass index is 53.97 kg/m.      Diabetes Self-Management Education - 05/12/17 1112      Visit Information   Visit Type First/Initial     Initial Visit   Diabetes Type Type 2   Are you currently following a meal plan? No   Date Diagnosed 2018     Health Coping   How would you rate your overall health? Fair     Psychosocial Assessment   Patient Belief/Attitude about Diabetes Motivated to manage diabetes   Self-care barriers None   Self-management support Doctor's office   Other persons present Patient   Special Needs None   Preferred Learning Style Visual;Auditory;Hands on   Learning Readiness Contemplating   How often do you need to have someone help you when  you read instructions, pamphlets, or other written materials from your doctor or pharmacy? 1 - Never   What is the last grade level you completed in school? 12     Pre-Education Assessment   Patient understands the diabetes disease and treatment process. Needs Instruction   Patient understands incorporating nutritional management into lifestyle. Needs Instruction   Patient undertands incorporating physical activity into lifestyle. Needs Instruction   Patient understands using medications safely. Needs Instruction   Patient understands monitoring blood glucose, interpreting and using results Needs Instruction   Patient understands prevention, detection, and treatment of acute complications. Needs Instruction   Patient understands prevention, detection, and treatment of chronic complications. Needs Instruction   Patient understands how to develop strategies to address psychosocial issues. Needs Instruction   Patient understands how to develop strategies to promote health/change behavior. Needs Instruction     Complications   Last HgB A1C per patient/outside source 7.1 %   How often do you check your blood sugar? 0 times/day (not testing)   Have you had a dilated eye exam in the past 12 months? No   Have you had a dental exam in the past 12 months? Yes   Are you checking your feet? No     Dietary Intake   Breakfast 2 eggs and 3 [place] bacon,  water   Lunch skipped   Dinner 2 boneless chiciken breast, cauliflower, green beans and pototoes and Pepsi  Beverage(s) Water, Soda, Tea     Exercise   Exercise Type ADL's     Patient Education   Previous Diabetes Education No   Disease state  Factors that contribute to the development of diabetes;Explored patient's options for treatment of their diabetes   Nutrition management  Role of diet in the treatment of diabetes and the relationship between the three main macronutrients and blood glucose level;Food label reading, portion sizes and  measuring food.;Carbohydrate counting;Information on hints to eating out and maintain blood glucose control.;Effects of alcohol on blood glucose and safety factors with consumption of alcohol.   Physical activity and exercise  Identified with patient nutritional and/or medication changes necessary with exercise.;Role of exercise on diabetes management, blood pressure control and cardiac health.   Medications Reviewed patients medication for diabetes, action, purpose, timing of dose and side effects.;Reviewed medication adjustment guidelines for hyperglycemia and sick days.   Monitoring Taught/evaluated SMBG meter.;Purpose and frequency of SMBG.;Taught/discussed recording of test results and interpretation of SMBG.;Interpreting lab values - A1C, lipid, urine microalbumina.;Identified appropriate SMBG and/or A1C goals.   Acute complications Taught treatment of hypoglycemia - the 15 rule.;Discussed and identified patients' treatment of hyperglycemia.   Chronic complications Relationship between chronic complications and blood glucose control;Assessed and discussed foot care and prevention of foot problems;Lipid levels, blood glucose control and heart disease;Identified and discussed with patient  current chronic complications;Retinopathy and reason for yearly dilated eye exams;Nephropathy, what it is, prevention of, the use of ACE, ARB's and early detection of through urine microalbumia.;Reviewed with patient heart disease, higher risk of, and prevention   Psychosocial adjustment Role of stress on diabetes     Individualized Goals (developed by patient)   Nutrition Follow meal plan discussed;General guidelines for healthy choices and portions discussed   Physical Activity Exercise 3-5 times per week;30 minutes per day   Medications --   Talk to MD about starting Metformin   Monitoring  test my blood glucose as discussed   Reducing Risk examine blood glucose patterns;do foot checks daily;treat hypoglycemia  with 15 grams of carbs if blood glucose less than 70mg /dL;increase portions of healthy fats;increase portions of olive oil in diet     Post-Education Assessment   Patient understands the diabetes disease and treatment process. Needs Review   Patient understands incorporating nutritional management into lifestyle. Needs Review   Patient undertands incorporating physical activity into lifestyle. Needs Review   Patient understands using medications safely. Needs Review   Patient understands monitoring blood glucose, interpreting and using results Needs Review   Patient understands prevention, detection, and treatment of acute complications. Needs Review   Patient understands prevention, detection, and treatment of chronic complications. Needs Review   Patient understands how to develop strategies to address psychosocial issues. Needs Review   Patient understands how to develop strategies to promote health/change behavior. Needs Review     Outcomes   Expected Outcomes Demonstrated interest in learning. Expect positive outcomes   Future DMSE 4-6 wks   Program Status Completed      Individualized Plan for Diabetes Self-Management Training:   Learning Objective:  Patient will have a greater understanding of diabetes self-management. Patient education plan is to attend individual and/or group sessions per assessed needs and concerns.   Plan:   Patient Instructions  Goals 1. Follow My Plate 2. Eat 2-3 carb choices per meal 3. Cut out sweet tea and sodas Avoid skipping meals 4. Increase fresh fruits and vegetables. 5. Exercises 2-3 times per weeik 30 minutes 6. Test  blood sugars twice  A day Lose 1 lb per week    Expected Outcomes:  Demonstrated interest in learning. Expect positive outcomes  Education material provided: Living Well with Diabetes, Food label handouts, A1C conversion sheet, Meal plan card, My Plate and Carbohydrate counting sheet  If problems or questions, patient to  contact team via:  Phone and Email  Future DSME appointment: 4-6 wks

## 2017-06-15 ENCOUNTER — Ambulatory Visit: Payer: Managed Care, Other (non HMO) | Admitting: Nutrition

## 2018-04-18 ENCOUNTER — Other Ambulatory Visit (HOSPITAL_COMMUNITY): Payer: Self-pay | Admitting: Nurse Practitioner

## 2018-04-18 DIAGNOSIS — R102 Pelvic and perineal pain: Secondary | ICD-10-CM

## 2018-04-18 DIAGNOSIS — N852 Hypertrophy of uterus: Secondary | ICD-10-CM

## 2018-04-21 ENCOUNTER — Ambulatory Visit (HOSPITAL_COMMUNITY)
Admission: RE | Admit: 2018-04-21 | Discharge: 2018-04-21 | Disposition: A | Discharge status: Home / Self Care | Payer: Self-pay | Source: Ambulatory Visit | Attending: Nurse Practitioner | Admitting: Nurse Practitioner

## 2018-04-21 DIAGNOSIS — R102 Pelvic and perineal pain: Secondary | ICD-10-CM | POA: Insufficient documentation

## 2018-04-21 DIAGNOSIS — N852 Hypertrophy of uterus: Secondary | ICD-10-CM | POA: Insufficient documentation

## 2018-04-21 DIAGNOSIS — N949 Unspecified condition associated with female genital organs and menstrual cycle: Secondary | ICD-10-CM | POA: Insufficient documentation

## 2018-04-21 DIAGNOSIS — N83201 Unspecified ovarian cyst, right side: Secondary | ICD-10-CM | POA: Insufficient documentation

## 2018-04-28 ENCOUNTER — Other Ambulatory Visit (HOSPITAL_COMMUNITY): Payer: Self-pay | Admitting: Nurse Practitioner

## 2018-04-28 DIAGNOSIS — N83201 Unspecified ovarian cyst, right side: Secondary | ICD-10-CM

## 2018-07-18 ENCOUNTER — Other Ambulatory Visit (HOSPITAL_COMMUNITY)
Admission: RE | Admit: 2018-07-18 | Discharge: 2018-07-18 | Disposition: A | Discharge status: Home / Self Care | Payer: Self-pay | Source: Ambulatory Visit | Attending: Nurse Practitioner | Admitting: Nurse Practitioner

## 2018-07-18 DIAGNOSIS — R87610 Atypical squamous cells of undetermined significance on cytologic smear of cervix (ASC-US): Secondary | ICD-10-CM | POA: Insufficient documentation

## 2018-07-19 ENCOUNTER — Other Ambulatory Visit: Payer: Self-pay | Admitting: Nurse Practitioner

## 2018-07-21 ENCOUNTER — Ambulatory Visit (HOSPITAL_COMMUNITY)
Admission: RE | Admit: 2018-07-21 | Discharge: 2018-07-21 | Disposition: A | Discharge status: Home / Self Care | Payer: Self-pay | Source: Ambulatory Visit | Attending: Nurse Practitioner | Admitting: Nurse Practitioner

## 2018-07-21 DIAGNOSIS — N83201 Unspecified ovarian cyst, right side: Secondary | ICD-10-CM
